# Patient Record
Sex: Male | Born: 1990 | Race: White | Hispanic: No | Marital: Single | Smoking: Current every day smoker
Health system: Southern US, Community
[De-identification: ages and names within clinical notes are randomized; demographics above are authoritative.]

## PROBLEM LIST (undated history)

## (undated) DIAGNOSIS — Z21 Asymptomatic human immunodeficiency virus [HIV] infection status: Secondary | ICD-10-CM

## (undated) DIAGNOSIS — G039 Meningitis, unspecified: Secondary | ICD-10-CM

## (undated) DIAGNOSIS — F32A Depression, unspecified: Secondary | ICD-10-CM

## (undated) DIAGNOSIS — F319 Bipolar disorder, unspecified: Secondary | ICD-10-CM

## (undated) DIAGNOSIS — F988 Other specified behavioral and emotional disorders with onset usually occurring in childhood and adolescence: Secondary | ICD-10-CM

## (undated) DIAGNOSIS — F329 Major depressive disorder, single episode, unspecified: Secondary | ICD-10-CM

## (undated) DIAGNOSIS — F419 Anxiety disorder, unspecified: Secondary | ICD-10-CM

## (undated) HISTORY — DX: Other specified behavioral and emotional disorders with onset usually occurring in childhood and adolescence: F98.8

## (undated) HISTORY — DX: Depression, unspecified: F32.A

## (undated) HISTORY — DX: Bipolar disorder, unspecified: F31.9

## (undated) HISTORY — DX: Meningitis, unspecified: G03.9

## (undated) HISTORY — DX: Anxiety disorder, unspecified: F41.9

## (undated) HISTORY — DX: Major depressive disorder, single episode, unspecified: F32.9

## (undated) HISTORY — PX: NO PAST SURGERIES: SHX2092

---
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
## 2009-07-18 ENCOUNTER — Telehealth: Payer: Self-pay | Admitting: *Deleted

## 2010-03-20 ENCOUNTER — Encounter: Admission: RE | Admit: 2010-03-20 | Discharge: 2010-03-20 | Payer: Self-pay | Admitting: Family Medicine

## 2013-03-23 ENCOUNTER — Ambulatory Visit: Payer: Self-pay | Admitting: Family Medicine

## 2013-04-21 ENCOUNTER — Ambulatory Visit: Payer: Self-pay | Admitting: Family Medicine

## 2013-04-21 DIAGNOSIS — Z0289 Encounter for other administrative examinations: Secondary | ICD-10-CM

## 2014-04-28 ENCOUNTER — Ambulatory Visit (INDEPENDENT_AMBULATORY_CARE_PROVIDER_SITE_OTHER): Payer: BC Managed Care – PPO | Admitting: Family Medicine

## 2014-04-28 VITALS — BP 122/74 | HR 105 | Temp 98.1°F | Resp 16 | Ht 71.5 in | Wt 212.0 lb

## 2014-04-28 DIAGNOSIS — F430 Acute stress reaction: Secondary | ICD-10-CM

## 2014-04-28 DIAGNOSIS — R079 Chest pain, unspecified: Secondary | ICD-10-CM

## 2014-04-28 MED ORDER — HYDROXYZINE HCL 25 MG PO TABS: 12.5000 mg | ORAL_TABLET | Freq: Three times a day (TID) | ORAL | Status: DC | PRN

## 2014-04-28 NOTE — Progress Notes (Signed)
Subjective:    Patient ID: Nunzio CobbsJonathan Beattie, male    DOB: 09/06/1990, 24 y.o.   MRN: [id number]  HPI [facility] many years. He presents today with chest pain and anxiety. The patient saw his psychiatrist last week and requested to have his Vyvanse increased from 60 mg to 70 mg so he can concentrate for exams. The plan was to slowly decrease vyvanse over summer when he wasn't in school. He sees the psychiatrist for ADHD. He does not receive regular medical care anywhere. He denies any history of anxiety. He is in school at [employer] with a double major in history and asian studies. He also works 30 hours a week at [employer]. His sister died of a drug overdose about a month ago and he missed a week of school. Exams will be over in 2 weeks. He reports that he has been going "24/7" without a day off from work/school. He will not be attending school this summer, will just be working full time. He is looking forward to the break.  His chest pain started 3 days ago and was worse yesterday. Chest pain is mid sternal and slightly left lateral. Constant, dull pain with intermittent sharp pain. Feels like heart is beating fast. Has trembling and brief blurred vision. Chest pain is better when he is moving around; notices it more if he is sitting and studying. Usually exercises 3x/week. Has not exercised since last week due to busy schedule.  He is able to sleep with melatonin. During this last week is sleeping 6-8 hours a night.   FH- Mother and Father are alive and well. Grandfather died at age 26 of heart attack. Grandmother died at age 26, had CHF/ open heart surgery during last 20 years of her life. Grandfather died of annurysm in mid 4870s.  He denies homicidal or suicidal ideations.  Review of Systems No fever, no cough, + mild nausea, no heartburn    Objective:   Physical Exam  Vitals reviewed. Constitutional: He is oriented to person, place, and time. He appears  well-developed and well-nourished.  Very pleasant male in NAD, mildly diaphoretic during initial part of exam. This resolved as visit progressed.  HENT:  Head: Normocephalic and atraumatic.  Right Ear: External ear normal.  Left Ear: External ear normal.  Nose: Nose normal.  Mouth/Throat: Oropharynx is clear and moist.  Eyes: Conjunctivae are normal. Right eye exhibits no discharge. Left eye exhibits no discharge. No scleral icterus.  Neck: Normal range of motion. Neck supple.  Cardiovascular: Normal rate, regular rhythm and normal heart sounds.   Mild tachycardia.   Pulmonary/Chest: Effort normal and breath sounds normal. No respiratory distress. He has no wheezes. He has no rales. He exhibits no tenderness.  Musculoskeletal: Normal range of motion.  Neurological: He is alert and oriented to person, place, and time.  Skin: Skin is warm and dry.  Psychiatric: His behavior is normal. Judgment and thought content normal.  Patient mildly anxious, became less anxious appearing as visit progressed.    EKG- reviewed by Dr. Katrinka BlazingSmith- non-specific T wave changes, bradycardia.    Assessment & Plan:  1. Chest pain - EKG 12-Lead  2. Stress response -Discussed with Dr. Katrinka BlazingSmith -Discussed EKG findings with patient. Reassured patient.  -Encouraged patient to call psychiatrist and discuss reducing dose of Vyvanse. - Vistaril 25 mg, 1/2-1 tablet every 8 hours as needed for anxiety -can take ibuprofen 400 mg po q 8 hours for pain -  encouraged continued exercise for stress relief -RTC if any worsening in symptoms, or if no improvement  Emi Belfast, FNP-BC  [facility]  04/28/2014 4:12 PM

## 2014-05-05 NOTE — Progress Notes (Signed)
History and physical examinations reviewed with Deboraha Sprang, FNP.  EKG reviewed. Agree with A/P.

## 2014-05-08 ENCOUNTER — Ambulatory Visit (INDEPENDENT_AMBULATORY_CARE_PROVIDER_SITE_OTHER): Payer: BC Managed Care – PPO | Admitting: Family Medicine

## 2014-05-08 VITALS — BP 130/84 | HR 94 | Temp 98.0°F | Resp 18 | Ht 71.0 in | Wt 218.0 lb

## 2014-05-08 DIAGNOSIS — J351 Hypertrophy of tonsils: Secondary | ICD-10-CM

## 2014-05-08 DIAGNOSIS — H9209 Otalgia, unspecified ear: Secondary | ICD-10-CM

## 2014-05-08 DIAGNOSIS — J3489 Other specified disorders of nose and nasal sinuses: Secondary | ICD-10-CM

## 2014-05-08 DIAGNOSIS — R0981 Nasal congestion: Secondary | ICD-10-CM

## 2014-05-08 DIAGNOSIS — J029 Acute pharyngitis, unspecified: Secondary | ICD-10-CM

## 2014-05-08 LAB — POCT RAPID STREP A (OFFICE): Rapid Strep A Screen: NEGATIVE

## 2014-05-08 MED ORDER — AMOXICILLIN 875 MG PO TABS: 875.0000 mg | ORAL_TABLET | Freq: Two times a day (BID) | ORAL | Status: DC

## 2014-05-08 NOTE — Progress Notes (Signed)
Chief Complaint:  Chief Complaint  Patient presents with  . Sore Throat    x 1 day  . Nasal Congestion    HPI: Collin [name] is a 24 y.o. male who is here for 1 day history of sore throat and nasal congestion, it has been draining clear liquid, he denies allergies or asthma. He wants to feel better and has 2 exams and [place] on Monday. No fevers or chills. Has had strep throat in the past several times and was about to remove tonsils but never did. He has tried nothing for this.   He has had a tough semester. HE was seen on 04/28/14 for chest pain , anxiety. At that time he had requested his psychiatrist change his Vyvanse increased from 60 mg to 70 mg so he can concentrate for exams. He is still on a higher dose. The plan was to slowly decrease vyvanse over summer when he wasn't in school. He sees the psychiatrist for ADHD.  He is in school at [employer] with a double major in history and international studies. He also works 30 hours a week at [employer]. His sister died of a drug overdose about a month ago and he missed a week of school. He is in the midst of exams .   He has not dealt with his sisters death, he sttes he will do it all once exams are over. He is no suicidal or homicidal.   Past Medical History  Diagnosis Date  . Anxiety    History reviewed. No pertinent past surgical history. History   Social History  . Marital Status: Single    Spouse Name: N/A    Number of Children: N/A  . Years of Education: N/A   Social History Main Topics  . Smoking status: Current Every Day Smoker  . Smokeless tobacco: Never Used  . Alcohol Use: Yes  . Drug Use: None  . Sexual Activity: None   Other Topics Concern  . None   Social History Narrative  . None   Family History  Problem Relation Age of Onset  . Hypertension Father   . Stroke Paternal Uncle   . Heart disease Maternal Grandmother   . Heart disease Maternal Grandfather   . Stroke Paternal  Grandfather    No Known Allergies Prior to Admission medications   Medication Sig Start Date End Date Taking? Authorizing Provider  hydrOXYzine (ATARAX/VISTARIL) 25 MG tablet Take 0.5-1 tablets (12.5-25 mg total) by mouth every 8 (eight) hours as needed for anxiety. 04/28/14  Yes Collin Belfast, FNP  lisdexamfetamine (VYVANSE) 70 MG capsule Take 70 mg by mouth daily.   Yes Historical Provider, MD     ROS: The patient denies fevers, chills, night sweats, unintentional weight loss, chest pain, palpitations, wheezing, dyspnea on exertion, nausea, vomiting, abdominal pain, dysuria, hematuria, melena, numbness, weakness, or tingling.   All other systems have been reviewed and were otherwise negative with the exception of those mentioned in the HPI and as above.    PHYSICAL EXAM: Filed Vitals:   05/08/14 0926  BP: 130/84  Pulse: 94  Temp: 98 F (36.7 C)  Resp: 18   Filed Vitals:   05/08/14 0926  Height: 5\' 11"  (1.803 m)  Weight: 218 lb (98.884 kg)   Body mass index is 30.42 kg/(m^2).  General: Alert, no acute distress HEENT:  Normocephalic, atraumatic, oropharynx patent. EOMI, PERRLA, tm nl, no exudates, +enlarged red tonsils, nontener sinuses Cardiovascular:  Regular rate  and rhythm, no rubs murmurs or gallops.  No Carotid bruits, radial pulse intact. No pedal edema.  Respiratory: Clear to auscultation bilaterally.  No wheezes, rales, or rhonchi.  No cyanosis, no use of accessory musculature GI: No organomegaly, abdomen is soft and non-tender, positive bowel sounds.  No masses. Skin: No rashes. Neurologic: Facial musculature symmetric. Psychiatric: Patient is appropriate throughout our interaction. Lymphatic: No cervical lymphadenopathy Musculoskeletal: Gait intact.   LABS: Results for orders placed in visit on 12/16/07  CONVERTED [facility] LAB      Result Value Ref Range   Rapid Strep negative       EKG/XRAY:   Primary read interpreted [facility] Conley [name] at  [facility].   ASSESSMENT/PLAN: Encounter Diagnoses  Name Primary?  . Acute pharyngitis Yes  . Nasal congestion   . Otalgia   . Enlarged tonsils    MOstl likely stress related and allergies, he is decompensating physically due to lack of rest.  Rx Amoxacillin  Salt water gargles Throat cx pending Advise to seek bereavement couseling and continue seeing therapis/psychiatrist F/u prn  Gross sideeffects, risk and benefits, and alternatives of medications d/w patient. Patient is aware that all medications have potential sideeffects and we are unable to predict every sideeffect or drug-drug interaction that may occur.  Collin Antuhao P Ghali Morissette, DO 05/08/2014 10:57 AM

## 2014-05-08 NOTE — Patient Instructions (Signed)
Pharyngitis °Pharyngitis is redness, pain, and swelling (inflammation) of your pharynx.  °CAUSES  °Pharyngitis is usually caused by infection. Most of the time, these infections are from viruses (viral) and are part of a cold. However, sometimes pharyngitis is caused by bacteria (bacterial). Pharyngitis can also be caused by allergies. Viral pharyngitis may be spread from person to person by coughing, sneezing, and personal items or utensils (cups, forks, spoons, toothbrushes). Bacterial pharyngitis may be spread from person to person by more intimate contact, such as kissing.  °SIGNS AND SYMPTOMS  °Symptoms of pharyngitis include:   °· Sore throat.   °· Tiredness (fatigue).   °· Low-grade fever.   °· Headache. °· Joint pain and muscle aches. °· Skin rashes. °· Swollen lymph nodes. °· Plaque-like film on throat or tonsils (often seen with bacterial pharyngitis). °DIAGNOSIS  °Your health care provider will ask you questions about your illness and your symptoms. Your medical history, along with a physical exam, is often all that is needed to diagnose pharyngitis. Sometimes, a rapid strep test is done. Other lab tests may also be done, depending on the suspected cause.  °TREATMENT  °Viral pharyngitis will usually get better in 3 4 days without the use of medicine. Bacterial pharyngitis is treated with medicines that kill germs (antibiotics).  °HOME CARE INSTRUCTIONS  °· Drink enough water and fluids to keep your urine clear or pale yellow.   °· Only take over-the-counter or prescription medicines as directed by your health care provider:   °· If you are prescribed antibiotics, make sure you finish them even if you start to feel better.   °· Do not take aspirin.   °· Get lots of rest.   °· Gargle with 8 oz of salt water (½ tsp of salt per 1 qt of water) as often as every 1 2 hours to soothe your throat.   °· Throat lozenges (if you are not at risk for choking) or sprays may be used to soothe your throat. °SEEK MEDICAL  CARE IF:  °· You have large, tender lumps in your neck. °· You have a rash. °· You cough up green, yellow-brown, or bloody spit. °SEEK IMMEDIATE MEDICAL CARE IF:  °· Your neck becomes stiff. °· You drool or are unable to swallow liquids. °· You vomit or are unable to keep medicines or liquids down. °· You have severe pain that does not go away with the use of recommended medicines. °· You have trouble breathing (not caused by a stuffy nose). °MAKE SURE YOU:  °· Understand these instructions. °· Will watch your condition. °· Will get help right away if you are not doing well or get worse. °Document Released: 12/17/2005 Document Revised: 10/07/2013 Document Reviewed: 08/24/2013 °ExitCare® Patient Information ©2014 [employer]. ° °

## 2014-05-10 LAB — CULTURE, GROUP A STREP: ORGANISM ID, BACTERIA: NORMAL

## 2014-11-17 ENCOUNTER — Telehealth: Payer: Self-pay

## 2014-11-17 ENCOUNTER — Ambulatory Visit (INDEPENDENT_AMBULATORY_CARE_PROVIDER_SITE_OTHER): Payer: BC Managed Care – PPO | Admitting: Physician Assistant

## 2014-11-17 VITALS — BP 128/72 | HR 81 | Temp 97.9°F | Resp 16 | Ht 72.0 in | Wt 194.8 lb

## 2014-11-17 DIAGNOSIS — Z202 Contact with and (suspected) exposure to infections with a predominantly sexual mode of transmission: Secondary | ICD-10-CM

## 2014-11-17 DIAGNOSIS — I889 Nonspecific lymphadenitis, unspecified: Secondary | ICD-10-CM

## 2014-11-17 LAB — CBC WITH DIFFERENTIAL/PLATELET
BASOS PCT: 0 % (ref 0–1)
Basophils Absolute: 0 10*3/uL (ref 0.0–0.1)
EOS ABS: 0 10*3/uL (ref 0.0–0.7)
Eosinophils Relative: 0 % (ref 0–5)
HCT: 46.1 % (ref 39.0–52.0)
HEMOGLOBIN: 15.9 g/dL (ref 13.0–17.0)
LYMPHS ABS: 2.5 10*3/uL (ref 0.7–4.0)
Lymphocytes Relative: 31 % (ref 12–46)
MCH: 29.2 pg (ref 26.0–34.0)
MCHC: 34.5 g/dL (ref 30.0–36.0)
MCV: 84.6 fL (ref 78.0–100.0)
MPV: 9.9 fL (ref 9.4–12.4)
Monocytes Absolute: 0.6 10*3/uL (ref 0.1–1.0)
Monocytes Relative: 7 % (ref 3–12)
NEUTROS ABS: 5.1 10*3/uL (ref 1.7–7.7)
Neutrophils Relative %: 62 % (ref 43–77)
PLATELETS: 254 10*3/uL (ref 150–400)
RBC: 5.45 MIL/uL (ref 4.22–5.81)
RDW: 14.9 % (ref 11.5–15.5)
WBC: 8.2 10*3/uL (ref 4.0–10.5)

## 2014-11-17 NOTE — Progress Notes (Signed)
Subjective:    Patient ID: Collin [name], male    DOB: 07/11/1990, 24 y.o.   MRN: [id number]  HPI Patient presents with for STD testing and lymph adenitis after unprotected sex 3 weeks ago. Was the receiver of unprotected male sx and partner denies current STDs. Had protected vaginal sex and unprotected oral sex with male partner. 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 Denies penile discharge, pain, or blood; scrotal swelling or pain; or rectal pain, discharge, or blood. No urinary sx. Felt lymph nodes in L armpit, L chin, and R groin. Spoke with HIV counselor and is adamant that he has to have HIV viral load testing and willing to pay additional cost if insurance does not cover cost.  PFSH reviewed for this encounter.  Review of Systems  Constitutional: Negative for fever, chills, fatigue and unexpected weight change.  HENT: Negative for congestion and sore throat.   Respiratory: Negative for cough.   Gastrointestinal: Positive for nausea (secondary to anxiety). Negative for vomiting and abdominal pain.  Genitourinary: Negative for dysuria, frequency, hematuria, flank pain, decreased urine volume, discharge, penile swelling, scrotal swelling, difficulty urinating, penile pain and testicular pain.  Skin: Negative for rash.  Hematological: Positive for adenopathy.  Psychiatric/Behavioral: Negative for behavioral problems and dysphoric mood. The patient is nervous/anxious.        Objective:   Physical Exam  Constitutional: He is oriented to person, place, and time. He appears well-developed and well-nourished. No distress.  Blood pressure 128/72, pulse 81, temperature 97.9 F (36.6 C), temperature source Oral, resp. rate 16, height 6' (1.829 m), weight 194 lb 12.8 oz (88.361 kg), SpO2 100 %.   HENT:  Head: Normocephalic and atraumatic.  Eyes: Right eye exhibits no discharge. Left eye exhibits no discharge. No scleral icterus.  Neck: Neck  supple.  Cardiovascular: Normal rate, regular rhythm and normal heart sounds.  Exam reveals no gallop and no friction rub.   No murmur heard. Pulmonary/Chest: Effort normal and breath sounds normal. No respiratory distress. He has no wheezes. He has no rales.  Abdominal: Soft. Bowel sounds are normal. He exhibits no mass. There is no tenderness. There is no rebound and no guarding. Hernia confirmed negative in the right inguinal area and confirmed negative in the left inguinal area.  Genitourinary: Rectum normal and penis normal. Right testis shows tenderness. Right testis shows no mass and no swelling. Right testis is descended. Left testis shows no mass, no swelling and no tenderness. Left testis is descended. No penile tenderness.  Lymphadenopathy:       Head (right side): No submental, no submandibular, no tonsillar, no preauricular, no posterior auricular and no occipital adenopathy present.       Head (left side): No submental, no submandibular, no tonsillar, no preauricular, no posterior auricular and no occipital adenopathy present.    He has no cervical adenopathy.    He has no axillary adenopathy.       Right: No inguinal and no supraclavicular adenopathy present.       Left: No inguinal and no supraclavicular adenopathy present.  Neurological: He is alert and oriented to person, place, and time.  Skin: Skin is warm and intact. No lesion and no rash noted. He is diaphoretic (patient states he is very nervous). No erythema. No pallor.  Psychiatric: His speech is normal and behavior is normal. Judgment and thought content normal. His mood appears anxious. His affect is not angry. Cognition and memory are  normal. He does not exhibit a depressed mood.        Assessment & Plan:  1. Exposure to STD - HSV(herpes simplex vrs) 1+2 ab-IgG - RPR - GC/Chlamydia Probe Amp - HIV 1 RNA quant-no reflex-bld - CBC with Differential - HIV antibody - RTC in 3 months, if HIV antibody neg for  retesting.  2. Lymphadenitis Patient reported. Not seen/felt on exam. - CBC with Differential    Janan Ridgeishira Shaniyah Wix PA-C  [facility] 11/17/2014 3:24 PM

## 2014-11-17 NOTE — Patient Instructions (Signed)

## 2014-11-17 NOTE — Telephone Encounter (Signed)
Pt states he has questions about a particular diagnosis that was on his paperwork when he left today, He would like a call back re same   Best phone [phone]

## 2014-11-17 NOTE — Telephone Encounter (Signed)
Pt had questions about his diagnosis of lymphadenitis. Pt reported not seen/felt on exam (see Tishira's note). Let pt know that Mal Mistyishira had put this as a dx so that hopefully some of his labs would be covered by ins. Discussed with Chelle. Pt understood and had no further questions.

## 2014-11-18 LAB — HIV ANTIBODY (ROUTINE TESTING W REFLEX): HIV 1&2 Ab, 4th Generation: NONREACTIVE

## 2014-11-18 LAB — HSV(HERPES SIMPLEX VRS) I + II AB-IGG

## 2014-11-18 LAB — HIV-1 RNA QUANT-NO REFLEX-BLD: HIV-1 RNA Quant, Log: <1.3 {Log} (ref <1.30)

## 2014-11-18 LAB — RPR

## 2014-11-22 ENCOUNTER — Encounter: Payer: Self-pay | Admitting: Physician Assistant

## 2014-11-22 ENCOUNTER — Telehealth: Payer: Self-pay | Admitting: Physician Assistant

## 2014-11-22 NOTE — Telephone Encounter (Signed)
Called to discuss labs. No vmail set up. Labs normal. Will send letter.

## 2014-11-22 NOTE — Progress Notes (Signed)
I have discussed this case with Ms. Brewington, PA-C and agree.  

## 2014-11-22 NOTE — Telephone Encounter (Signed)
Pt notified of labs. Sheketia, pt says he is happy to come back in if is Uriprobe was never sent. Just call him and let him know.

## 2014-11-24 NOTE — Telephone Encounter (Signed)
Pt will be in on Friday the 27th to recollect uriprobe

## 2014-11-26 ENCOUNTER — Telehealth: Payer: Self-pay | Admitting: Physician Assistant

## 2014-11-26 DIAGNOSIS — Z202 Contact with and (suspected) exposure to infections with a predominantly sexual mode of transmission: Secondary | ICD-10-CM

## 2014-11-30 NOTE — Telephone Encounter (Signed)
Spoke with patient. Agrees to RTC on 12/01/14 to recollect urine for GC probe. Informed him of other negative labs and re-enforced condom use again.

## 2015-03-08 ENCOUNTER — Ambulatory Visit (INDEPENDENT_AMBULATORY_CARE_PROVIDER_SITE_OTHER): Payer: Self-pay | Admitting: Family Medicine

## 2015-03-08 VITALS — BP 118/80 | HR 110 | Temp 98.0°F | Resp 18 | Ht 72.0 in | Wt 207.0 lb

## 2015-03-08 DIAGNOSIS — R5383 Other fatigue: Secondary | ICD-10-CM | POA: Diagnosis not present

## 2015-03-08 DIAGNOSIS — F32A Depression, unspecified: Secondary | ICD-10-CM

## 2015-03-08 DIAGNOSIS — R195 Other fecal abnormalities: Secondary | ICD-10-CM

## 2015-03-08 DIAGNOSIS — L739 Follicular disorder, unspecified: Secondary | ICD-10-CM

## 2015-03-08 DIAGNOSIS — F329 Major depressive disorder, single episode, unspecified: Secondary | ICD-10-CM

## 2015-03-08 DIAGNOSIS — F410 Panic disorder [episodic paroxysmal anxiety] without agoraphobia: Secondary | ICD-10-CM | POA: Diagnosis not present

## 2015-03-08 DIAGNOSIS — Z113 Encounter for screening for infections with a predominantly sexual mode of transmission: Secondary | ICD-10-CM

## 2015-03-08 LAB — COMPLETE METABOLIC PANEL WITH GFR
ALBUMIN: 4.7 g/dL (ref 3.5–5.2)
ALT: 17 U/L (ref 0–53)
AST: 20 U/L (ref 0–37)
Alkaline Phosphatase: 57 U/L (ref 39–117)
BUN: 8 mg/dL (ref 6–23)
CALCIUM: 9.5 mg/dL (ref 8.4–10.5)
CHLORIDE: 103 meq/L (ref 96–112)
CO2: 26 mEq/L (ref 19–32)
Creat: 0.89 mg/dL (ref 0.50–1.35)
GFR, Est African American: >89 mL/min
GLUCOSE: 81 mg/dL (ref 70–99)
POTASSIUM: 4.2 meq/L (ref 3.5–5.3)
Sodium: 141 mEq/L (ref 135–145)
Total Bilirubin: 0.5 mg/dL (ref 0.2–1.2)
Total Protein: 7.5 g/dL (ref 6.0–8.3)

## 2015-03-08 LAB — POCT CBC
GRANULOCYTE PERCENT: 58.2 % (ref 37–80)
HCT, POC: 48 % (ref 43.5–53.7)
Hemoglobin: 16.2 g/dL (ref 14.1–18.1)
Lymph, poc: 3.2 (ref 0.6–3.4)
MCH, POC: 30.4 pg (ref 27–31.2)
MCHC: 33.8 g/dL (ref 31.8–35.4)
MCV: 89.9 fL (ref 80–97)
MID (CBC): 0.3 (ref 0–0.9)
MPV: 7.1 fL (ref 0–99.8)
PLATELET COUNT, POC: 245 10*3/uL (ref 142–424)
POC Granulocyte: 4.9 (ref 2–6.9)
POC LYMPH PERCENT: 37.9 %L (ref 10–50)
POC MID %: 3.9 % (ref 0–12)
RBC: 5.34 M/uL (ref 4.69–6.13)
RDW, POC: 12.2 %
WBC: 8.5 10*3/uL (ref 4.6–10.2)

## 2015-03-08 LAB — TSH: TSH: 0.877 u[IU]/mL (ref 0.350–4.500)

## 2015-03-08 MED ORDER — CLINDAMYCIN PHOSPHATE 1 % EX SOLN: Freq: Two times a day (BID) | CUTANEOUS | Status: DC

## 2015-03-08 MED ORDER — SERTRALINE HCL 50 MG PO TABS: 50.0000 mg | ORAL_TABLET | Freq: Every day | ORAL | Status: DC

## 2015-03-08 NOTE — Progress Notes (Signed)
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 He has a history of ADHD and sees a psychiatrist for that. Dr. Mervyn Skeeters (on [facility]) who is his psychiatrist offered him antidepressant a couple months ago and the patient declined. He recently was changed from Vyvanse to Adderall. He has been extremely fatigued the last 2 weeks. His sister committed suicide about a year ago. He said his other sister got pregnant and had the baby baptized a week or 2 ago near the anniversary of the sister's death and "that was a little traumatic to him. He is stressed with the last semester of school. He is not getting regular exercise. He has been gaining weight. He started a"cleansing diet" yesterday. He is spiritual and has been getting back into going to church occasionally. He doesn't have friends. He is mostly in his apartment alone and little contact with people. He has a little depressed but denies any suicidal thoughts. There is a fairly strong family history of suicide with an AFI also committed suicide. He has some diarrhea. His stomach just doesn't feel well.  Objective Overweight young man. He's got a lot of folliculitis on his chest wall and some on his back. This bothers him. His throat is clear. Neck supple without nodes thyromegaly. No carotid bruits. Chest clear. Heart regular without murmurs. And soft without mass or tenderness.  Assessment: Fatigue Depression/occasional panic Weight gain Folliculitis ADHD  Plan: Called his psychiatrist who did not call me back until long after the patient gone. I did place the patient on sertraline 50 mg one daily and the psychiatrist agrees with that. Labs were ordered. Treat the folliculitis with some Cleocin T. Decided to not give him doxycycline right now since he is having some loose stools.   Results for orders placed or performed in visit on 03/08/15  POCT CBC  Result Value Ref Range   WBC 8.5 4.6 - 10.2 K/uL   Lymph, poc 3.2 0.6 - 3.4   POC LYMPH  PERCENT 37.9 10 - 50 %L   MID (cbc) 0.3 0 - 0.9   POC MID % 3.9 0 - 12 %M   POC Granulocyte 4.9 2 - 6.9   Granulocyte percent 58.2 37 - 80 %G   RBC 5.34 4.69 - 6.13 M/uL   Hemoglobin 16.2 14.1 - 18.1 g/dL   HCT, POC 51.7 61.6 - 53.7 %   MCV 89.9 80 - 97 fL   MCH, POC 30.4 27 - 31.2 pg   MCHC 33.8 31.8 - 35.4 g/dL   RDW, POC 07.3 %   Platelet Count, POC 245 142 - 424 K/uL   MPV 7.1 0 - 99.8 fL   See instructions. See him back in a couple of weeks, sooner if needed.

## 2015-03-08 NOTE — Patient Instructions (Signed)
Take sertraline 1 each morning  Regular exercise  Use the Cleocin topical on the rash areas of your chest wall and back once or twice daily  Return in 2-3 weeks. Call over here and find out when I'll be in the office before you come in.

## 2015-03-09 ENCOUNTER — Encounter: Payer: Self-pay | Admitting: *Deleted

## 2015-03-09 LAB — HIV ANTIBODY (ROUTINE TESTING W REFLEX): HIV 1&2 Ab, 4th Generation: NONREACTIVE

## 2015-03-09 LAB — RPR

## 2015-03-31 ENCOUNTER — Ambulatory Visit (INDEPENDENT_AMBULATORY_CARE_PROVIDER_SITE_OTHER): Payer: BLUE CROSS/BLUE SHIELD | Admitting: Family Medicine

## 2015-03-31 VITALS — BP 114/78 | HR 110 | Temp 98.4°F | Resp 16 | Ht 72.0 in | Wt 204.0 lb

## 2015-03-31 DIAGNOSIS — R3911 Hesitancy of micturition: Secondary | ICD-10-CM | POA: Diagnosis not present

## 2015-03-31 DIAGNOSIS — L290 Pruritus ani: Secondary | ICD-10-CM

## 2015-03-31 DIAGNOSIS — F411 Generalized anxiety disorder: Secondary | ICD-10-CM

## 2015-03-31 DIAGNOSIS — F329 Major depressive disorder, single episode, unspecified: Secondary | ICD-10-CM

## 2015-03-31 DIAGNOSIS — F32A Depression, unspecified: Secondary | ICD-10-CM

## 2015-03-31 DIAGNOSIS — Z711 Person with feared health complaint in whom no diagnosis is made: Secondary | ICD-10-CM

## 2015-03-31 DIAGNOSIS — K6289 Other specified diseases of anus and rectum: Secondary | ICD-10-CM

## 2015-03-31 DIAGNOSIS — K625 Hemorrhage of anus and rectum: Secondary | ICD-10-CM

## 2015-03-31 LAB — POCT CBC
Granulocyte percent: 64.5 %G (ref 37–80)
HCT, POC: 49.9 % (ref 43.5–53.7)
HEMOGLOBIN: 16.3 g/dL (ref 14.1–18.1)
LYMPH, POC: 3 (ref 0.6–3.4)
MCH: 29.5 pg (ref 27–31.2)
MCHC: 32.7 g/dL (ref 31.8–35.4)
MCV: 90.3 fL (ref 80–97)
MID (CBC): 0.5 (ref 0–0.9)
MPV: 7.6 fL (ref 0–99.8)
PLATELET COUNT, POC: 335 10*3/uL (ref 142–424)
POC Granulocyte: 6.3 (ref 2–6.9)
POC LYMPH %: 30.6 % (ref 10–50)
POC MID %: 4.9 % (ref 0–12)
RBC: 5.52 M/uL (ref 4.69–6.13)
RDW, POC: 14.5 %
WBC: 9.7 10*3/uL (ref 4.6–10.2)

## 2015-03-31 LAB — POCT URINALYSIS DIPSTICK
Glucose, UA: NEGATIVE
KETONES UA: 8
Leukocytes, UA: NEGATIVE
Nitrite, UA: NEGATIVE
PH UA: 6
PROTEIN UA: NEGATIVE
RBC UA: NEGATIVE
SPEC GRAV UA: 1.025
Urobilinogen, UA: 0.2

## 2015-03-31 MED ORDER — TAMSULOSIN HCL 0.4 MG PO CAPS: 0.4000 mg | ORAL_CAPSULE | Freq: Every day | ORAL | Status: DC

## 2015-03-31 MED ORDER — HYDROCORTISONE ACE-PRAMOXINE 2.5-1 % RE CREA: TOPICAL_CREAM | RECTAL | Status: DC

## 2015-03-31 NOTE — Progress Notes (Signed)
Subjective: 19 sexual male who was seen by me recently. His depression and anxiety of improved. He does have problems over the last couple weeks. He went for several days with having a lot of bright red rectal bleeding. He had perianal irritation in his mother who is a nurse check him in the did not see any hemorrhoids. He does have a history of being bisexual. Is been at least 3 months since he has been with a male. He is trying to get away from that, but has a male girlfriend. She is over and strain, but right after she left she developed a bad fever blister. He worries about herpes. About the timing of the rectal bleeding he had just generalized malaise and feeling real bad. His last GC chlamydia test was lost. He has had a difficult time urinating. Feels like it just takes a lot of work to make it go. In the morning often has to get in the shower to urinate.  Objective: No acute distress. Anxious. Chest clear. Heart regular without murmurs. And soft without mass or tenderness. No axillary or inguinal or cervical lymphadenopathy. Normal male external genitalia. Red around the urethra. No lesions on the penis. Normal testicles. No hernias. Anus appears normal, just a little skin erythema run around it. No fissures could be identified but PALPATION it felt a little thickened or scarred and very tender at about the 6:00 position. Digital exam was done and no lesions appreciated.  Assessment: Rectal bleeding Anxiety and depression improved STD risk Urinary hesitancy Anal pain  Plan: Urinalysis URiprobe CBC HSV testing

## 2015-03-31 NOTE — Patient Instructions (Signed)
Continue your antidepressant medication  Use the anal cream 2-3 times daily for irritation around your anus.  Take these tamsulosin 0.4 mg one at bedtime (or suppertime) to help relax the bladder outlet so you can urinate easier. Take caution especially the first few days when you get up quickly that you do not get lightheaded.  If exceeding further blood please return at anytime.  We will let you know the results of your labs in the next couple of days.  If doing well plan to return in late May to follow-up. If problems return sooner.

## 2015-04-02 LAB — GC/CHLAMYDIA PROBE AMP
CT PROBE, AMP APTIMA: NEGATIVE
GC Probe RNA: NEGATIVE

## 2015-04-05 LAB — HSV(HERPES SIMPLEX VRS) I + II AB-IGG
HSV 1 GLYCOPROTEIN G AB, IGG: 1.52 IV — AB
HSV 2 Glycoprotein G Ab, IgG: 2.05 IV — ABNORMAL HIGH

## 2015-04-06 ENCOUNTER — Telehealth: Payer: Self-pay | Admitting: *Deleted

## 2015-04-06 DIAGNOSIS — B009 Herpesviral infection, unspecified: Secondary | ICD-10-CM

## 2015-04-06 MED ORDER — VALACYCLOVIR HCL 1 G PO TABS: 1000.0000 mg | ORAL_TABLET | Freq: Two times a day (BID) | ORAL | Status: DC

## 2015-04-06 NOTE — Telephone Encounter (Signed)
Pt was called and spoken to by Angie P.    Valacyclovir  #20 was ordered through Epic and sent to [facility] on [address] by [employer]

## 2015-04-08 ENCOUNTER — Encounter: Payer: Self-pay | Admitting: Family Medicine

## 2015-05-07 ENCOUNTER — Other Ambulatory Visit: Payer: Self-pay | Admitting: Family Medicine

## 2015-05-09 NOTE — Telephone Encounter (Signed)
Do you want to give RFs? 

## 2016-03-25 DIAGNOSIS — F419 Anxiety disorder, unspecified: Secondary | ICD-10-CM | POA: Insufficient documentation

## 2016-03-25 DIAGNOSIS — M25562 Pain in left knee: Secondary | ICD-10-CM | POA: Insufficient documentation

## 2016-03-25 DIAGNOSIS — F1911 Other psychoactive substance abuse, in remission: Secondary | ICD-10-CM | POA: Insufficient documentation

## 2016-03-25 DIAGNOSIS — F988 Other specified behavioral and emotional disorders with onset usually occurring in childhood and adolescence: Secondary | ICD-10-CM | POA: Insufficient documentation

## 2016-05-02 ENCOUNTER — Encounter: Payer: Self-pay | Admitting: Neurology

## 2016-05-02 ENCOUNTER — Ambulatory Visit (INDEPENDENT_AMBULATORY_CARE_PROVIDER_SITE_OTHER): Payer: BLUE CROSS/BLUE SHIELD | Admitting: Neurology

## 2016-05-02 VITALS — BP 126/82 | HR 82 | Ht 72.0 in | Wt 228.0 lb

## 2016-05-02 DIAGNOSIS — F39 Unspecified mood [affective] disorder: Secondary | ICD-10-CM | POA: Diagnosis not present

## 2016-05-02 DIAGNOSIS — H9191 Unspecified hearing loss, right ear: Secondary | ICD-10-CM | POA: Diagnosis not present

## 2016-05-02 DIAGNOSIS — R410 Disorientation, unspecified: Secondary | ICD-10-CM | POA: Insufficient documentation

## 2016-05-02 NOTE — Progress Notes (Signed)
PATIENT: Collin [name] DOB: [birth year]  Chief Complaint  Patient presents with  . Audiory and Visual Hallucinations    He has a constellation of neurological concerns.       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  Patient reported that he experienced intermittent visual and auditory hallucinations especially during stressful situations, such as pushing himself in academic achievement, he often see blocks of blurs at the ceiling or on the floor, changing positions, sometimes he noticed ceiling and floor move up and down, auditory hallucinations, sometimes he woke up with loud whistling sound and bird chipping or people murmuring.  Reviewing the referring record, and history, patient reported a history of illicit drug abuse, currently use marijuana occasionally,  Patient reported that scored "high" on IQ test, but he has difficulty turning his assignment on time, trouble controlling his anxiety, struggle with his academic performance sometimes.  He wants to rule out neurological cause for his constellation of symptoms.  REVIEW OF SYSTEMS: Full 14 system review of systems performed and notable only for Hearing loss, itching, feeling hot, depression anxiety, decreased energy,  hallucinations, racing thoughts, confusion, insomnia, snoring  ALLERGIES: No Known Allergies  HOME MEDICATIONS: Current Outpatient Prescriptions  Medication Sig Dispense Refill  . amphetamine-dextroamphetamine (ADDERALL XR) 30 MG 24 hr capsule Take 30 mg by mouth daily.    . clindamycin (CLEOCIN-T) 1 % external solution Apply topically 2 (two) times daily. 60 mL 3  . hydrocortisone-pramoxine (ANALPRAM HC) 2.5-1 % rectal cream Use 2-3 times daily as needed for anal irritation 30 g 0  . sertraline (ZOLOFT) 50 MG tablet Take 1 tablet (50 mg total) by mouth daily. 30 tablet 2  . tamsulosin (FLOMAX) 0.4 MG CAPS capsule TAKE 1 CAPSULE (0.4 MG TOTAL) BY MOUTH DAILY AFTER SUPPER. 15 capsule 0  . valACYclovir (VALTREX) 1000 MG tablet Take 1 tablet (1,000 mg total) by mouth 2 (two) times daily. 20 tablet 0   No current facility-administered medications for this visit.    PAST MEDICAL HISTORY: Past Medical History  Diagnosis Date  . Anxiety   . Depression   . ADD (attention deficit disorder)   . Bipolar 1 disorder (HCC)   . Meningitis spinal     PAST SURGICAL HISTORY: No past surgical history on file.  FAMILY HISTORY: Family History  Problem Relation Age of Onset  . Hypertension Father   . Stroke Paternal Uncle   . Heart disease Maternal Grandmother   . Heart disease Maternal Grandfather   . Stroke Paternal Grandfather   . Healthy Mother     SOCIAL HISTORY:  Social History   Social History  . Marital Status: Single    Spouse Name: N/A  . Number of Children: 0  . Years of Education: Bachelors   Occupational History  . Student     Graduates  May 2017 - History/Philos[employer]   Social History Main Topics  . Smoking status: Current Every Day Smoker -- 0.50 packs/day    Types: Cigarettes  . Smokeless tobacco: Never Used  . Alcohol Use: 3.6 oz/week    6 Cans of beer per week     Comment: 6 beers per week  . Drug Use: Yes     Comment: Occasional marijuana use - 1-2 times per  month  . Sexual Activity: Yes    Birth Control/ Protection: Condom, None     Comment: With women and men   Other Topics Concern  . Not on file   Social History Narrative   Lives at home with parents.   Right-handed.   2 caffeinated drinks per day.     PHYSICAL EXAM   There were no vitals filed for this visit.  Not recorded      There is no weight on file to calculate BMI.  PHYSICAL EXAMNIATION:  Gen: NAD, conversant, well nourised, obese, well groomed                     Cardiovascular: Regular rate rhythm, no peripheral edema, warm, nontender. Eyes: Conjunctivae clear without exudates or hemorrhage Neck: Supple, no carotid bruise. Pulmonary: Clear to auscultation bilaterally   NEUROLOGICAL EXAM:  MENTAL STATUS: Speech:    Speech is normal; fluent and spontaneous with normal comprehension.  Cognition:     Orientation to time, place and person     Normal recent and remote memory     Normal Attention span and concentration     Normal Language, naming, repeating,spontaneous speech     Fund of knowledge   CRANIAL NERVES: CN II: Visual fields are full to confrontation. Fundoscopic exam is normal with sharp discs and no vascular changes. Pupils are round equal and briskly reactive to light. CN III, IV, VI: extraocular movement are normal. No ptosis. CN V: Facial sensation is intact to pinprick in all 3 divisions bilaterally. Corneal responses are intact.  CN VII: Face is symmetric with normal eye closure and smile. CN VIII: Decreased hearing on the right side CN IX, X: Palate elevates symmetrically. Phonation is normal. CN XI: Head turning and shoulder shrug are intact CN XII: Tongue is midline with normal movements and no atrophy.  MOTOR: There is no pronator drift of out-stretched arms. Muscle bulk and tone are normal. Muscle strength is normal.  REFLEXES: Reflexes are 2+ and symmetric at the biceps, triceps, knees, and ankles. Plantar responses are  flexor.  SENSORY: Intact to light touch, pinprick, positional sensation and vibratory sensation are intact in fingers and toes.  COORDINATION: Rapid alternating movements and fine finger movements are intact. There is no dysmetria on finger-to-nose and heel-knee-shin.    GAIT/STANCE: Posture is normal. Gait is steady with normal steps, base, arm swing, and turning. Heel and toe walking are normal. Tandem gait is normal.  Romberg is absent.   DIAGNOSTIC DATA (LABS, IMAGING, TESTING) - I reviewed patient records, labs, notes, testing and imaging myself where available.   ASSESSMENT AND PLAN  Gryphon Vanderveen is a 26 y.o. male   History of bacterial meningitis Mood disorder Right hearing loss  He wants to proceed with neurological evaluation to rule out neurological conditions to account for his chronic mood disorder, and right hearing loss, especially in light of his history of childhood bacterial meningitis,  We will proceed with MRI of the brain with and without contrast  Laboratory evaluations  Levert Feinstein, M.D. Ph.D.  [facility] [address], [address] [place], [place] [address] Ph: [phone] Fax: [phone]  CC: Charlies Silvers, PA-C

## 2016-05-03 LAB — COMPREHENSIVE METABOLIC PANEL
A/G RATIO: 1.8 (ref 1.2–2.2)
ALT: 30 IU/L (ref 0–44)
AST: 29 IU/L (ref 0–40)
Albumin: 4.9 g/dL (ref 3.5–5.5)
Alkaline Phosphatase: 81 IU/L (ref 39–117)
BUN/Creatinine Ratio: 15 (ref 9–20)
BUN: 15 mg/dL (ref 6–20)
Bilirubin Total: 0.4 mg/dL (ref 0.0–1.2)
CALCIUM: 10 mg/dL (ref 8.7–10.2)
CHLORIDE: 102 mmol/L (ref 96–106)
CO2: 23 mmol/L (ref 18–29)
Creatinine, Ser: 0.97 mg/dL (ref 0.76–1.27)
GFR calc Af Amer: 123 mL/min/{1.73_m2} (ref >59)
GFR, EST NON AFRICAN AMERICAN: 107 mL/min/{1.73_m2} (ref >59)
Globulin, Total: 2.8 g/dL (ref 1.5–4.5)
Glucose: 84 mg/dL (ref 65–99)
POTASSIUM: 5 mmol/L (ref 3.5–5.2)
Sodium: 142 mmol/L (ref 134–144)
Total Protein: 7.7 g/dL (ref 6.0–8.5)

## 2016-05-03 LAB — HEPATITIS PANEL, ACUTE
HEP A IGM: NEGATIVE
HEP B S AG: NEGATIVE
Hep B C IgM: NEGATIVE
Hep C Virus Ab: <0.1 s/co ratio (ref 0.0–0.9)

## 2016-05-03 LAB — CBC
Hematocrit: 47.8 % (ref 37.5–51.0)
Hemoglobin: 16.3 g/dL (ref 12.6–17.7)
MCH: 30.2 pg (ref 26.6–33.0)
MCHC: 34.1 g/dL (ref 31.5–35.7)
MCV: 89 fL (ref 79–97)
PLATELETS: 229 10*3/uL (ref 150–379)
RBC: 5.4 x10E6/uL (ref 4.14–5.80)
RDW: 13.6 % (ref 12.3–15.4)
WBC: 9.7 10*3/uL (ref 3.4–10.8)

## 2016-05-03 LAB — TSH: TSH: 0.763 u[IU]/mL (ref 0.450–4.500)

## 2016-12-11 ENCOUNTER — Emergency Department
Admission: EM | Admit: 2016-12-11 | Discharge: 2016-12-11 | Disposition: A | Discharge status: Home / Self Care | Payer: Self-pay | Attending: Emergency Medicine | Admitting: Emergency Medicine

## 2016-12-11 ENCOUNTER — Emergency Department: Payer: Self-pay

## 2016-12-11 ENCOUNTER — Encounter: Payer: Self-pay | Admitting: Emergency Medicine

## 2016-12-11 DIAGNOSIS — Z79899 Other long term (current) drug therapy: Secondary | ICD-10-CM | POA: Insufficient documentation

## 2016-12-11 DIAGNOSIS — R410 Disorientation, unspecified: Secondary | ICD-10-CM | POA: Insufficient documentation

## 2016-12-11 DIAGNOSIS — F909 Attention-deficit hyperactivity disorder, unspecified type: Secondary | ICD-10-CM | POA: Insufficient documentation

## 2016-12-11 DIAGNOSIS — F17210 Nicotine dependence, cigarettes, uncomplicated: Secondary | ICD-10-CM | POA: Insufficient documentation

## 2016-12-11 LAB — URINALYSIS, COMPLETE (UACMP) WITH MICROSCOPIC
BILIRUBIN URINE: NEGATIVE
GLUCOSE, UA: NEGATIVE mg/dL
HGB URINE DIPSTICK: NEGATIVE
Ketones, ur: 5 mg/dL — AB
LEUKOCYTES UA: NEGATIVE
NITRITE: NEGATIVE
PROTEIN: NEGATIVE mg/dL
RBC / HPF: NONE SEEN RBC/hpf (ref 0–5)
SPECIFIC GRAVITY, URINE: 1.024 (ref 1.005–1.030)
Squamous Epithelial / LPF: NONE SEEN
pH: 6 (ref 5.0–8.0)

## 2016-12-11 LAB — URINE DRUG SCREEN, QUALITATIVE (ARMC ONLY)
AMPHETAMINES, UR SCREEN: POSITIVE — AB
Barbiturates, Ur Screen: NOT DETECTED
Benzodiazepine, Ur Scrn: NOT DETECTED
CANNABINOID 50 NG, UR ~~LOC~~: NOT DETECTED
Cocaine Metabolite,Ur ~~LOC~~: NOT DETECTED
MDMA (ECSTASY) UR SCREEN: NOT DETECTED
METHADONE SCREEN, URINE: NOT DETECTED
Opiate, Ur Screen: NOT DETECTED
Phencyclidine (PCP) Ur S: NOT DETECTED
TRICYCLIC, UR SCREEN: NOT DETECTED

## 2016-12-11 LAB — CBC
HCT: 51.8 % (ref 40.0–52.0)
HEMOGLOBIN: 17.8 g/dL (ref 13.0–18.0)
MCH: 30.1 pg (ref 26.0–34.0)
MCHC: 34.3 g/dL (ref 32.0–36.0)
MCV: 87.6 fL (ref 80.0–100.0)
PLATELETS: 268 10*3/uL (ref 150–440)
RBC: 5.91 MIL/uL — AB (ref 4.40–5.90)
RDW: 12.6 % (ref 11.5–14.5)
WBC: 10.3 10*3/uL (ref 3.8–10.6)

## 2016-12-11 LAB — COMPREHENSIVE METABOLIC PANEL
ALK PHOS: 79 U/L (ref 38–126)
ALT: 73 U/L — AB (ref 17–63)
ANION GAP: 10 (ref 5–15)
AST: 44 U/L — ABNORMAL HIGH (ref 15–41)
Albumin: 5.2 g/dL — ABNORMAL HIGH (ref 3.5–5.0)
BILIRUBIN TOTAL: 0.6 mg/dL (ref 0.3–1.2)
BUN: 16 mg/dL (ref 6–20)
CALCIUM: 10.3 mg/dL (ref 8.9–10.3)
CO2: 27 mmol/L (ref 22–32)
CREATININE: 1.11 mg/dL (ref 0.61–1.24)
Chloride: 99 mmol/L — ABNORMAL LOW (ref 101–111)
GFR calc non Af Amer: >60 mL/min (ref >60)
Glucose, Bld: 98 mg/dL (ref 65–99)
Potassium: 4.6 mmol/L (ref 3.5–5.1)
Sodium: 136 mmol/L (ref 135–145)
TOTAL PROTEIN: 9.1 g/dL — AB (ref 6.5–8.1)

## 2016-12-11 NOTE — ED Provider Notes (Signed)
[facility] Emergency Department Provider Note  ____________________________________________   I have reviewed the triage vital signs and the nursing notes.   HISTORY  Chief Complaint Altered Mental Status   History limited by: Not Limited   HPI Collin [name] is a 26 y.o. male who presents to the emergency department today because of concerns for headache and confusion. The patient states that he has had a right-sided headache for roughly the past week. He describes a sensation of something moving around in his brain. It is located behind his right eye. He states that today he is also felt confused. He feels like he has had a hard time getting his speech. He has had similar symptoms in the past with his speech and confusion. He does have a psychiatric history states one time he was going to go see a neurologist however psychiatrist adjust his meds and he felt better. He denies any recent adjustment of his meds saying the last time they adjusted was 2 months ago. Patient otherwise has no other medical complaints.   Past Medical History:  Diagnosis Date  . ADD (attention deficit disorder)   . Anxiety   . Bipolar 1 disorder (HCC)   . Depression   . Meningitis spinal     Patient Active Problem List   Diagnosis Date Noted  . Mood disorder (HCC) 05/02/2016  . Confusion 05/02/2016  . Hearing loss in right ear 05/02/2016    Past Surgical History:  Procedure Laterality Date  . NO PAST SURGERIES      Prior to Admission medications   Medication Sig Start Date End Date Taking? Authorizing Provider  amphetamine-dextroamphetamine (ADDERALL XR) 30 MG 24 hr capsule Take 30 mg by mouth daily.    Historical Provider, MD  amphetamine-dextroamphetamine (ADDERALL) 5 MG tablet TAKE 1 TABLET BY MOUTH EVERY DAY AT LUNCH TIME FOR ADHD 04/08/16   Historical Provider, MD  sertraline (ZOLOFT) 50 MG tablet Take 1 tablet (50 mg total) by mouth daily. 03/08/15   Peyton Najjaravid H Hopper,  MD    Allergies Patient has no known allergies.  Family History  Problem Relation Age of Onset  . Hypertension Father   . Healthy Mother   . Stroke Paternal Uncle   . Heart disease Maternal Grandmother   . Heart disease Maternal Grandfather   . Stroke Paternal Grandfather     Social History Social History  Substance Use Topics  . Smoking status: Current Every Day Smoker    Packs/day: 0.50    Types: Cigarettes  . Smokeless tobacco: Never Used  . Alcohol use 3.6 oz/week    6 Cans of beer per week     Comment: 6 beers per week    Review of Systems  Constitutional: Negative for fever. Cardiovascular: Negative for chest pain. Respiratory: Negative for shortness of breath. Gastrointestinal: Negative for abdominal pain, vomiting and diarrhea. Genitourinary: Negative for dysuria. Musculoskeletal: Negative for back pain. Skin: Negative for rash. Neurological: Negative for headaches, focal weakness or numbness.  10-point ROS otherwise negative.  ____________________________________________   PHYSICAL EXAM:  VITAL SIGNS: ED Triage Vitals [12/11/16 1735]  Enc Vitals Group     BP (!) 137/103     Pulse Rate 98     Resp 18     Temp 98.1 F (36.7 C)     Temp Source Oral     SpO2 98 %     Weight      Height 6' (1.829 m)     Head Circumference  Peak Flow      Pain Score 3     Pain Loc      Pain Edu?      Excl. in GC?      Constitutional: Alert and oriented. Well appearing and in no distress. Eyes: Conjunctivae are normal. Normal extraocular movements. ENT   Head: Normocephalic and atraumatic.   Nose: No congestion/rhinnorhea.   Mouth/Throat: Mucous membranes are moist.   Neck: No stridor. Hematological/Lymphatic/Immunilogical: No cervical lymphadenopathy. Cardiovascular: Normal rate, regular rhythm.  No murmurs, rubs, or gallops.  Respiratory: Normal respiratory effort without tachypnea nor retractions. Breath sounds are clear and equal  bilaterally. No wheezes/rales/rhonchi. Gastrointestinal: Soft and non tender. No rebound. No guarding.  Genitourinary: Deferred Musculoskeletal: Normal range of motion in all extremities. No lower extremity edema. Neurologic:  Normal speech and language. No gross focal neurologic deficits are appreciated.  Skin:  Skin is warm, dry and intact. No rash noted.   ____________________________________________    LABS (pertinent positives/negatives)  Labs Reviewed  COMPREHENSIVE METABOLIC PANEL - Abnormal; Notable for the following:       Result Value   Chloride 99 (*)    Total Protein 9.1 (*)    Albumin 5.2 (*)    AST 44 (*)    ALT 73 (*)    All other components within normal limits  CBC - Abnormal; Notable for the following:    RBC 5.91 (*)    All other components within normal limits  URINE DRUG SCREEN, QUALITATIVE ([facility] ONLY) - Abnormal; Notable for the following:    Amphetamines, Ur Screen POSITIVE (*)    All other components within normal limits  URINALYSIS, COMPLETE (UACMP) WITH MICROSCOPIC - Abnormal; Notable for the following:    Color, Urine YELLOW (*)    APPearance CLEAR (*)    Ketones, ur 5 (*)    Bacteria, UA RARE (*)    All other components within normal limits  CBG MONITORING, ED     ____________________________________________   EKG  None  ____________________________________________    RADIOLOGY  None  ____________________________________________   PROCEDURES  Procedures  ____________________________________________   INITIAL IMPRESSION / ASSESSMENT AND PLAN / ED COURSE  Pertinent labs & imaging results that were available during my care of the patient were reviewed by me and considered in my medical decision making (see chart for details).  Patient presented to the emergency department today with concerns for confusion. Exam patient is no distress. Slightly pressured and tangential speech. He does have occasional positives in his speech.  Given that he was complaining of a headache a CT scan was done which did not show any acute intracranial abnormalities. Will plan on discharge patient to follow-up with psychiatry. He was also risk as seeing neurology follow-up. ____________________________________________   FINAL CLINICAL IMPRESSION(S) / ED DIAGNOSES  Final diagnoses:  Confusion     Note: This dictation was prepared with Dragon dictation. Any transcriptional errors that result from this process are unintentional     Phineas Semen, MD 12/11/16 2259

## 2016-12-11 NOTE — ED Triage Notes (Addendum)
Pt presents with confusion for one week. States he feels like he is confused and has some pressure in the right side of his head. Pt states he had meningitis when he was a baby and left him with little hearing on the right side. Pt denies hearing any voices and or SI or HI.  Pt states he takes zoloft and adderall.

## 2016-12-11 NOTE — Discharge Instructions (Signed)
Please seek medical attention for any high fevers, chest pain, shortness of breath, change in behavior, persistent vomiting, bloody stool or any other new or concerning symptoms.  

## 2018-03-03 IMAGING — CT CT HEAD W/O CM
3 series · 15 of 47 positions shown, 18 images · non-contrast
Comparison: 03/20/2010

CLINICAL DATA: Confusion for 1 week pressure on the right side of
head

EXAM:
CT HEAD WITHOUT CONTRAST
TECHNIQUE: Contiguous axial images were obtained from the base of the skull
through the vertex without intravenous contrast.

[Series 2: head wo · axial · 0.44mm/px · z∈[+158,+283]mm · 9 of 31 slices shown, 12 images]
[im 3/31  brain]
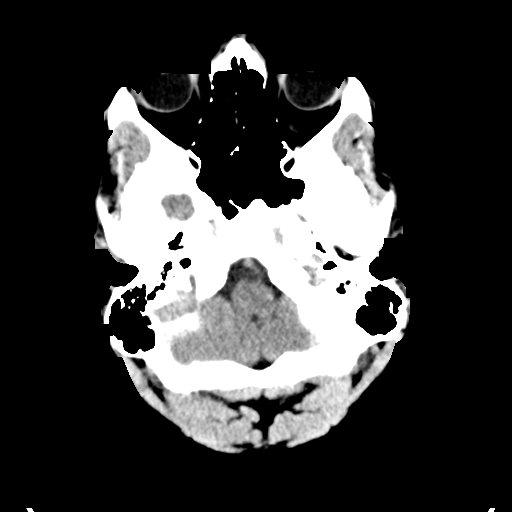
[im 3/31  bone]
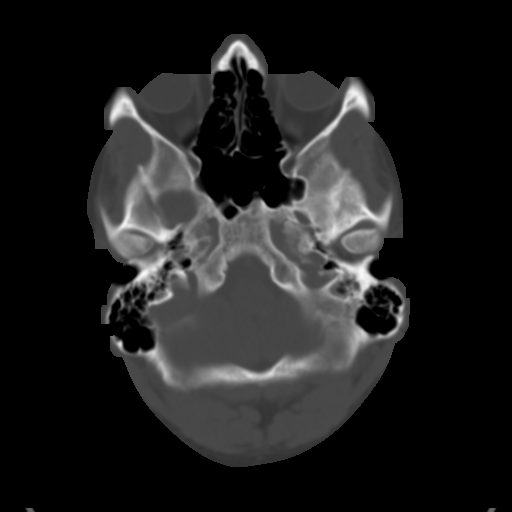
[im 6/31  brain]
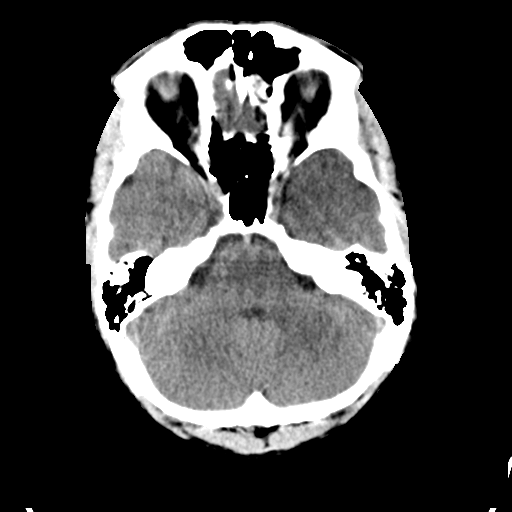
[im 9/31  brain]
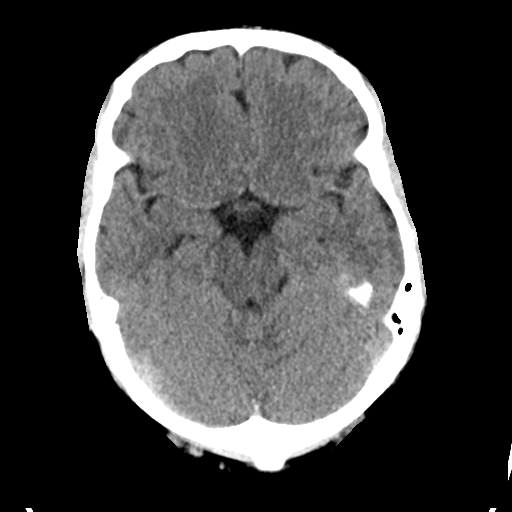
[im 12/31  brain]
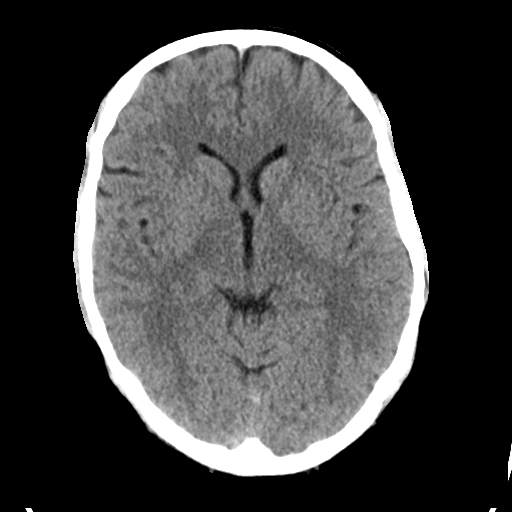
[im 16/31  brain]
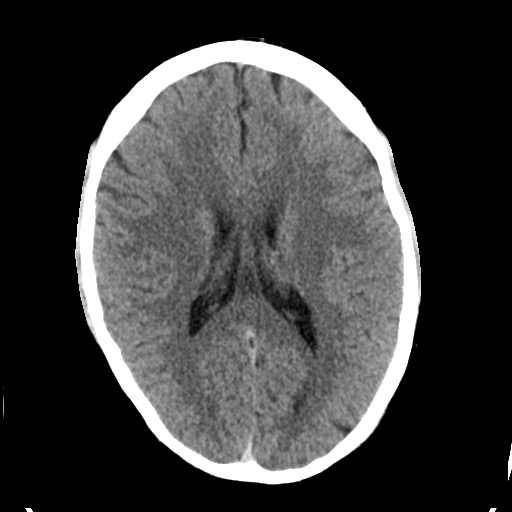
[im 16/31  bone]
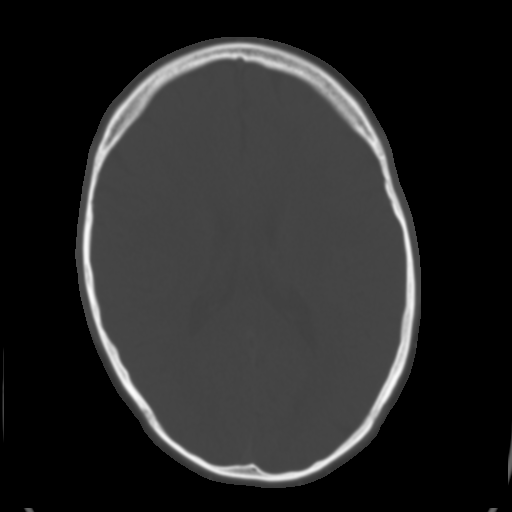
[im 19/31  brain]
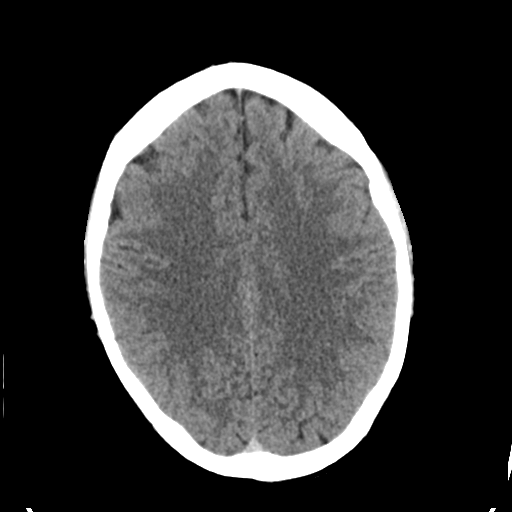
[im 22/31  brain]
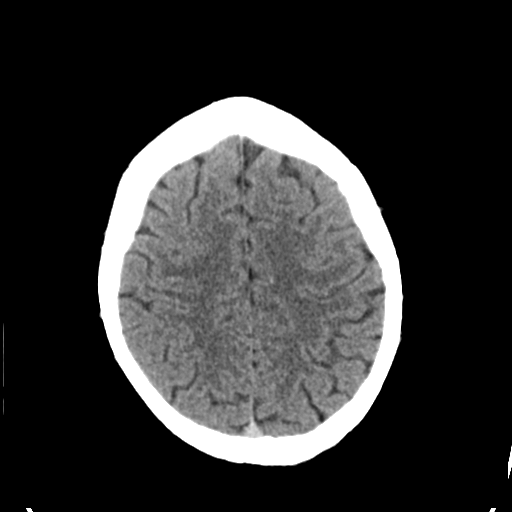
[im 25/31  brain]
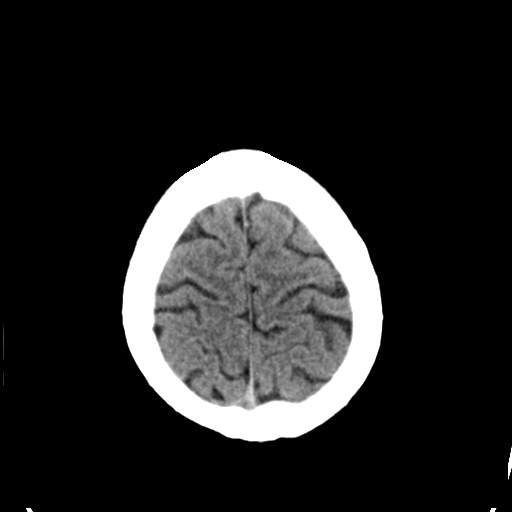
[im 28/31  brain]
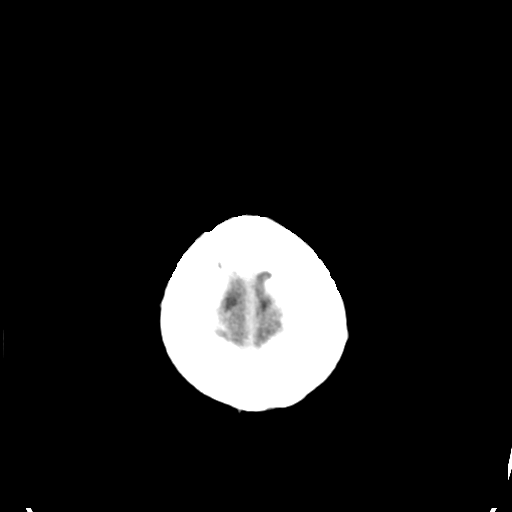
[im 28/31  bone]
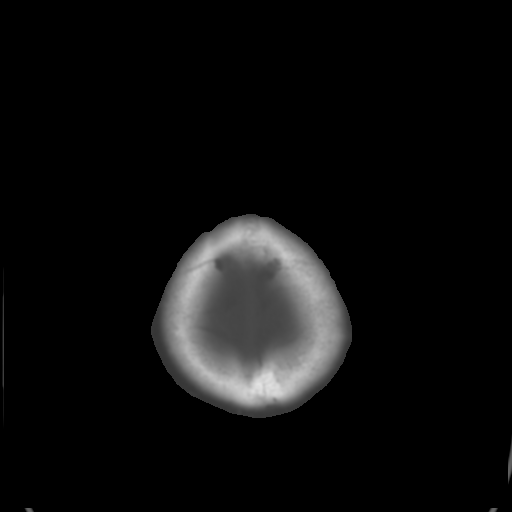

[Series 4: coronal soft tissue · coronal · 0.33mm/px · 3 of 69 slices shown]
[im 23/69  brain]
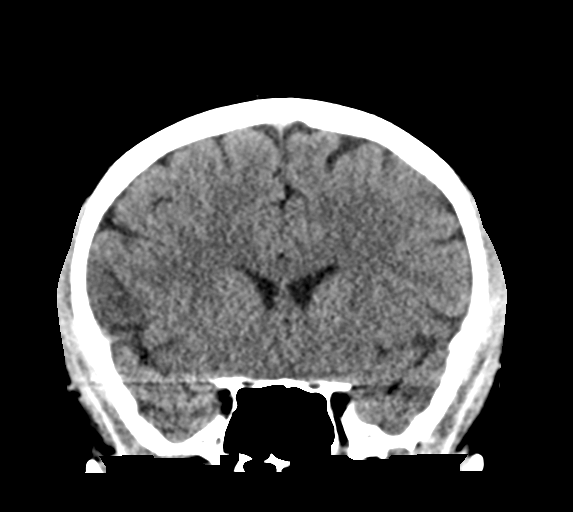
[im 31/69  brain]
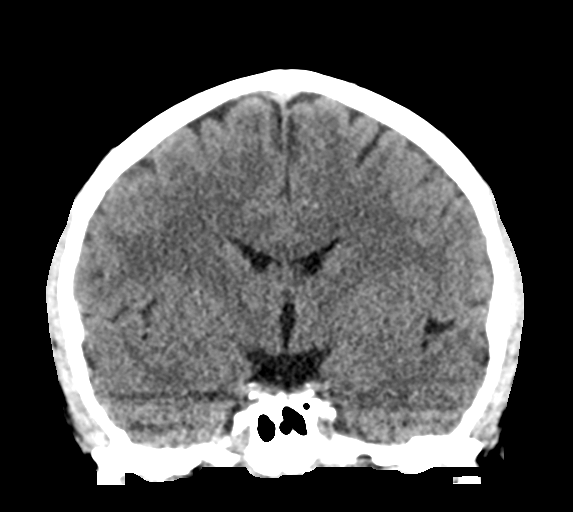
[im 38/69  brain]
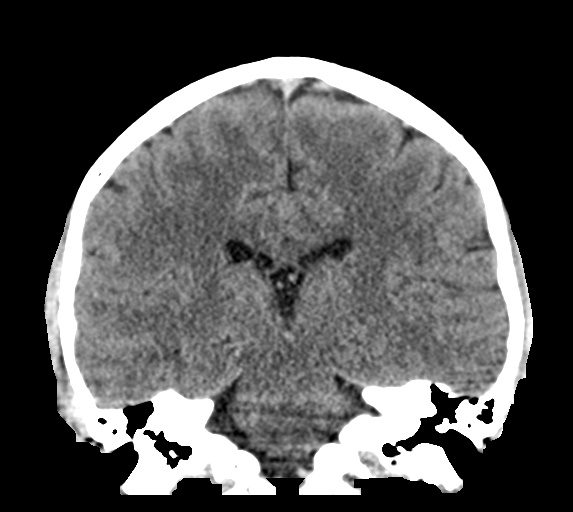

[Series 5: sagittal soft tissue · sagittal · 0.32mm/px · 3 of 55 slices shown]
[im 19/55  brain]
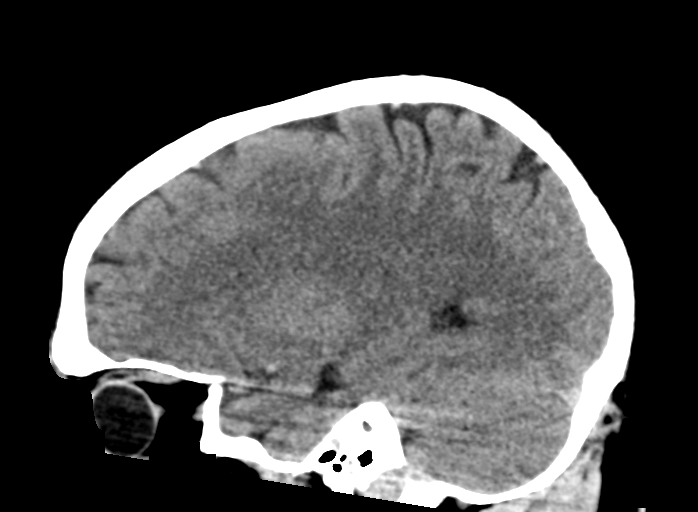
[im 28/55  brain]
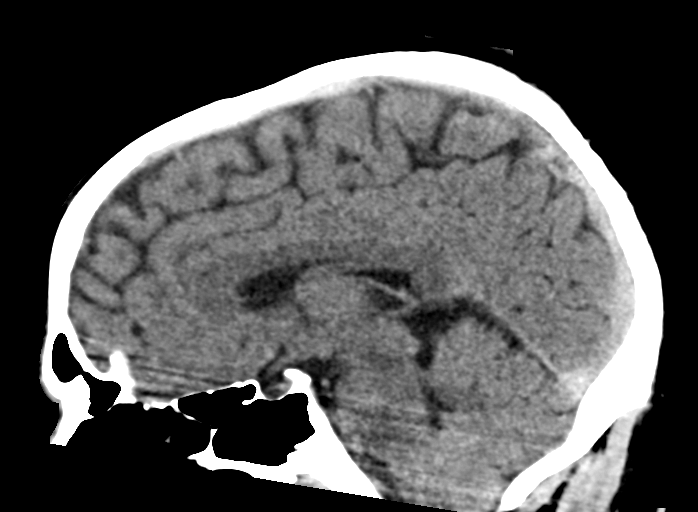
[im 37/55  brain]
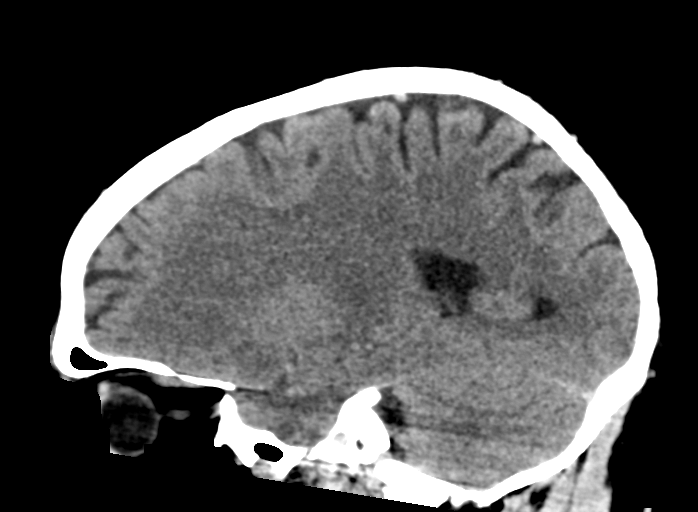

[15 of 47 positions shown; findings below may reference images not displayed]

FINDINGS: Brain: No evidence of acute infarction, hemorrhage, hydrocephalus,
extra-axial collection or mass lesion/mass effect.

Vascular: No hyperdense vessel or unexpected calcification.

Skull: Normal. Negative for fracture or focal lesion.

Sinuses/Orbits: No acute finding.

Other: None
IMPRESSION: No CT evidence for acute intracranial abnormality

## 2018-03-17 ENCOUNTER — Other Ambulatory Visit: Payer: Self-pay

## 2018-03-17 ENCOUNTER — Encounter: Payer: Self-pay | Admitting: Physician Assistant

## 2018-03-17 ENCOUNTER — Ambulatory Visit (INDEPENDENT_AMBULATORY_CARE_PROVIDER_SITE_OTHER): Payer: BLUE CROSS/BLUE SHIELD

## 2018-03-17 ENCOUNTER — Ambulatory Visit: Payer: BLUE CROSS/BLUE SHIELD | Admitting: Physician Assistant

## 2018-03-17 VITALS — BP 130/80 | HR 116 | Temp 98.3°F | Resp 18 | Ht 72.64 in | Wt 250.0 lb

## 2018-03-17 DIAGNOSIS — J029 Acute pharyngitis, unspecified: Secondary | ICD-10-CM | POA: Diagnosis not present

## 2018-03-17 DIAGNOSIS — R059 Cough, unspecified: Secondary | ICD-10-CM

## 2018-03-17 DIAGNOSIS — R05 Cough: Secondary | ICD-10-CM | POA: Diagnosis not present

## 2018-03-17 DIAGNOSIS — H6121 Impacted cerumen, right ear: Secondary | ICD-10-CM | POA: Diagnosis not present

## 2018-03-17 DIAGNOSIS — J069 Acute upper respiratory infection, unspecified: Secondary | ICD-10-CM

## 2018-03-17 DIAGNOSIS — R0989 Other specified symptoms and signs involving the circulatory and respiratory systems: Secondary | ICD-10-CM | POA: Diagnosis not present

## 2018-03-17 MED ORDER — BENZONATATE 100 MG PO CAPS: 100.0000 mg | ORAL_CAPSULE | Freq: Three times a day (TID) | ORAL | 0 refills | Status: DC | PRN

## 2018-03-17 MED ORDER — MUCINEX DM MAXIMUM STRENGTH 60-1200 MG PO TB12: 1.0000 | ORAL_TABLET | Freq: Two times a day (BID) | ORAL | 0 refills | Status: DC

## 2018-03-17 MED ORDER — AZELASTINE HCL 0.1 % NA SOLN: 2.0000 | Freq: Two times a day (BID) | NASAL | 0 refills | Status: DC

## 2018-03-17 NOTE — Progress Notes (Signed)
MRN: [id number] DOB: Oct 30, 1990  Subjective:   Collin [name] is a 28 y.o. male presenting for chief complaint of Cough (X 3 days- with some ear pain) and Sore Throat (X 4 days) .  Reports 4 day history of sore throat, ear fullness, dry cough, subjective fever, and body aches.  Has tried inhaler and mucinex with no full relief. Denies sinus pain, wheezing, shortness of breath and chest pain, nausea, vomiting, abdominal pain and diarrhea. Has had sick contact with coworkers.  No history of seasonal allergies, no history of asthma. Has hx of bronchitis and pneumonia. Patient has not had flu shot this season. Current smoker. Smokes up to a pack per day.  Has smoked since age 13.  In terms of ear fullness,right ear feels worse.  Denies ear pain and itching.  Has history of hearing loss in the right ear.  No other questions or concerns. Collin [name] has a current medication list which includes the following prescription(s): lisdexamfetamine, sertraline, amphetamine-dextroamphetamine, and amphetamine-dextroamphetamine. Also has No Known Allergies.  Collin [name]  has a past medical history of ADD (attention deficit disorder), Anxiety, Bipolar 1 disorder (HCC), Depression, and Meningitis spinal. Also  has a past surgical history that includes No past surgeries.   Objective:   Vitals: BP 130/80 (BP Location: Right Arm, Patient Position: Sitting, Cuff Size: Large)   Pulse (!) 116   Temp 98.3 F (36.8 C) (Oral)   Resp 18   Ht 6' 0.64" (1.845 m)   Wt 250 lb (113.4 kg)   SpO2 97%   BMI 33.31 kg/m   Physical Exam  Constitutional: He is oriented to person, place, and time. He appears well-developed and well-nourished. He does not appear ill.  HENT:  Head: Normocephalic and atraumatic.  Right Ear: There is drainage (dark brown cerumen blocking view of TM).  Left Ear: External ear and ear canal normal. Tympanic membrane is erythematous and retracted. Tympanic membrane is not bulging.  Nose:  Rhinorrhea (bilaterally) present. Right sinus exhibits no maxillary sinus tenderness and no frontal sinus tenderness. Left sinus exhibits no maxillary sinus tenderness and no frontal sinus tenderness.  Mouth/Throat: Uvula is midline and mucous membranes are normal. Posterior oropharyngeal erythema present. No posterior oropharyngeal edema. Tonsils are 2+ on the right. Tonsils are 2+ on the left. No tonsillar exudate.  Tonsils are erythematous bilaterally  Eyes: Conjunctivae are normal.  Neck: Normal range of motion.  Cardiovascular: Regular rhythm and normal heart sounds. Tachycardia present.  Pulmonary/Chest: Effort normal. He has no wheezes. He has no rales.  Coarse breath sounds auscultated in lung fields bilaterally.   Lymphadenopathy:       Head (right side): No submental, no submandibular, no tonsillar, no preauricular, no posterior auricular and no occipital adenopathy present.       Head (left side): No submental, no submandibular, no tonsillar, no preauricular, no posterior auricular and no occipital adenopathy present.    He has no cervical adenopathy.       Right: No supraclavicular adenopathy present.       Left: No supraclavicular adenopathy present.  Neurological: He is alert and oriented to person, place, and time.  Skin: Skin is warm and dry.  Psychiatric: He has a normal mood and affect.  Vitals reviewed.   No results found for this or any previous visit (from the past 24 hour(s)). Dg Chest 2 View  Result Date: 03/17/2018 CLINICAL DATA:  Abnormal breath sounds, history of tobacco use EXAM: CHEST - 2 VIEW COMPARISON:  01/11/2010  FINDINGS: The heart size and mediastinal contours are within normal limits. Both lungs are clear. The visualized skeletal structures are unremarkable. IMPRESSION: No active cardiopulmonary disease. Electronically Signed   By: Alcide Clever M.D.   On: 03/17/2018 16:54   Post ear lavage, canal is clear. TM is normal, no bulging or erythema.  Assessment  and Plan :  1. Cough - DG Chest 2 View; Future - benzonatate (TESSALON) 100 MG capsule; Take 1-2 capsules (100-200 mg total) by mouth 3 (three) times daily as needed for cough.  Dispense: 40 capsule; Refill: 0 - Dextromethorphan-Guaifenesin (MUCINEX DM MAXIMUM STRENGTH) 60-1200 MG TB12; Take 1 tablet by mouth every 12 (twelve) hours.  Dispense: 20 each; Refill: 0  2. Runny nose - azelastine (ASTELIN) 0.1 % nasal spray; Place 2 sprays into both nostrils 2 (two) times daily. Use in each nostril as directed  Dispense: 30 mL; Refill: 0  3. Sore throat 4. Acute upper respiratory infection CXR normal. Hx and PE findings consistent with URI. Likely viral etiology. Will tx symptomatically at this time.  Advised to return to clinic if symptoms worsen, do not improve, or as needed.  5. Impacted cerumen of right ear Resolved.  - Ear Lavage  Benjiman Core, PA-C  Primary Care at [facility] 03/17/2018 5:31 PM

## 2018-03-17 NOTE — Patient Instructions (Addendum)
- We will treat this as a respiratory viral infection.  - I recommend you rest, drink plenty of fluids, eat light meals including soups.  - You may use over the counter delsym cough syrup at night for your cough and sore throat, Tessalon pearls during the day to stop the cough and mucinex DM to help bring stuff up. - For runny nose, use nasal spray.  - You may also use Tylenol or ibuprofen over-the-counter for your sore throat and also over the counter cepacol lozenges. - Please let me know if you are not seeing any improvement or get worse in 5-7 days.   Upper Respiratory Infection, Adult Most upper respiratory infections (URIs) are caused by a virus. A URI affects the nose, throat, and upper air passages. The most common type of URI is often called "the common cold." Follow these instructions at home:  Take medicines only as told by your doctor.  Gargle warm saltwater or take cough drops to comfort your throat as told by your doctor.  Use a warm mist humidifier or inhale steam from a shower to increase air moisture. This may make it easier to breathe.  Drink enough fluid to keep your pee (urine) clear or pale yellow.  Eat soups and other clear broths.  Have a healthy diet.  Rest as needed.  Go back to work when your fever is gone or your doctor says it is okay. ? You may need to stay home longer to avoid giving your URI to others. ? You can also wear a face mask and wash your hands often to prevent spread of the virus.  Use your inhaler more if you have asthma.  Do not use any tobacco products, including cigarettes, chewing tobacco, or electronic cigarettes. If you need help quitting, ask your doctor. Contact a doctor if:  You are getting worse, not better.  Your symptoms are not helped by medicine.  You have chills.  You are getting more short of breath.  You have brown or red mucus.  You have yellow or brown discharge from your nose.  You have pain in your face,  especially when you bend forward.  You have a fever.  You have puffy (swollen) neck glands.  You have pain while swallowing.  You have white areas in the back of your throat. Get help right away if:  You have very bad or constant: ? Headache. ? Ear pain. ? Pain in your forehead, behind your eyes, and over your cheekbones (sinus pain). ? Chest pain.  You have long-lasting (chronic) lung disease and any of the following: ? Wheezing. ? Long-lasting cough. ? Coughing up blood. ? A change in your usual mucus.  You have a stiff neck.  You have changes in your: ? Vision. ? Hearing. ? Thinking. ? Mood. This information is not intended to replace advice given to you by your health care provider. Make sure you discuss any questions you have with your health care provider. Document Released: 06/04/2008 Document Revised: 08/19/2016 Document Reviewed: 03/24/2014 Elsevier Interactive Patient Education  2018 [employer].    IF you received an x-ray today, you will receive an invoice from [facility]. Please contact [facility] at [phone] with questions or concerns regarding your invoice.   IF you received labwork today, you will receive an invoice from [facility]. Please contact [facility] at [phone] with questions or concerns regarding your invoice.   Our billing staff will not be able to assist you with questions regarding bills  from these companies.  You will be contacted with the lab results as soon as they are available. The fastest way to get your results is to activate your My Chart account. Instructions are located on the last page of this paperwork. If you have not heard from [place] regarding the results in 2 weeks, please contact this office.

## 2018-04-13 ENCOUNTER — Other Ambulatory Visit: Payer: Self-pay | Admitting: Physician Assistant

## 2018-04-13 DIAGNOSIS — R0989 Other specified symptoms and signs involving the circulatory and respiratory systems: Secondary | ICD-10-CM

## 2018-04-14 NOTE — Telephone Encounter (Signed)
azelastine refill Last OV: 03/17/18 Last Refill:03/17/18 30 ml Pharmacy:[facility] [address] PCP: Benjiman Core PA-C

## 2018-12-24 ENCOUNTER — Encounter (HOSPITAL_COMMUNITY): Payer: Self-pay

## 2018-12-24 ENCOUNTER — Emergency Department (HOSPITAL_COMMUNITY): Payer: BLUE CROSS/BLUE SHIELD

## 2018-12-24 ENCOUNTER — Emergency Department (HOSPITAL_COMMUNITY)
Admission: EM | Admit: 2018-12-24 | Discharge: 2018-12-24 | Disposition: A | Discharge status: Home / Self Care | Payer: BLUE CROSS/BLUE SHIELD | Attending: Emergency Medicine | Admitting: Emergency Medicine

## 2018-12-24 ENCOUNTER — Other Ambulatory Visit: Payer: Self-pay

## 2018-12-24 DIAGNOSIS — Z79899 Other long term (current) drug therapy: Secondary | ICD-10-CM | POA: Diagnosis not present

## 2018-12-24 DIAGNOSIS — F17210 Nicotine dependence, cigarettes, uncomplicated: Secondary | ICD-10-CM | POA: Diagnosis not present

## 2018-12-24 DIAGNOSIS — R1032 Left lower quadrant pain: Secondary | ICD-10-CM | POA: Diagnosis present

## 2018-12-24 DIAGNOSIS — K529 Noninfective gastroenteritis and colitis, unspecified: Secondary | ICD-10-CM

## 2018-12-24 LAB — BASIC METABOLIC PANEL
Anion gap: 7 (ref 5–15)
BUN: 13 mg/dL (ref 6–20)
CO2: 27 mmol/L (ref 22–32)
Calcium: 9.1 mg/dL (ref 8.9–10.3)
Chloride: 105 mmol/L (ref 98–111)
Creatinine, Ser: 1.21 mg/dL (ref 0.61–1.24)
GFR calc Af Amer: >60 mL/min (ref >60)
GFR calc non Af Amer: >60 mL/min (ref >60)
GLUCOSE: 91 mg/dL (ref 70–99)
POTASSIUM: 4.1 mmol/L (ref 3.5–5.1)
Sodium: 139 mmol/L (ref 135–145)

## 2018-12-24 LAB — ABO/RH: ABO/RH(D): O POS

## 2018-12-24 LAB — CBC WITH DIFFERENTIAL/PLATELET
ABS IMMATURE GRANULOCYTES: 0.02 10*3/uL (ref 0.00–0.07)
Basophils Absolute: 0.1 10*3/uL (ref 0.0–0.1)
Basophils Relative: 1 %
Eosinophils Absolute: 0.1 10*3/uL (ref 0.0–0.5)
Eosinophils Relative: 2 %
HEMATOCRIT: 46.2 % (ref 39.0–52.0)
HEMOGLOBIN: 15.5 g/dL (ref 13.0–17.0)
IMMATURE GRANULOCYTES: 0 %
LYMPHS ABS: 2.6 10*3/uL (ref 0.7–4.0)
LYMPHS PCT: 33 %
MCH: 30.1 pg (ref 26.0–34.0)
MCHC: 33.5 g/dL (ref 30.0–36.0)
MCV: 89.7 fL (ref 80.0–100.0)
MONOS PCT: 12 %
Monocytes Absolute: 1 10*3/uL (ref 0.1–1.0)
NEUTROS ABS: 4.2 10*3/uL (ref 1.7–7.7)
NEUTROS PCT: 52 %
Platelets: 213 10*3/uL (ref 150–400)
RBC: 5.15 MIL/uL (ref 4.22–5.81)
RDW: 12.1 % (ref 11.5–15.5)
WBC: 7.9 10*3/uL (ref 4.0–10.5)
nRBC: 0 % (ref 0.0–0.2)

## 2018-12-24 LAB — TYPE AND SCREEN
ABO/RH(D): O POS
ANTIBODY SCREEN: NEGATIVE

## 2018-12-24 MED ORDER — IOPAMIDOL (ISOVUE-300) INJECTION 61%
INTRAVENOUS | Status: DC
Start: 2018-12-24 — End: 2018-12-24
  Filled 2018-12-24: qty 100

## 2018-12-24 MED ORDER — SODIUM CHLORIDE 0.9 % IV SOLN
INTRAVENOUS | Status: DC
  Administered 2018-12-24: 11:00:00 via INTRAVENOUS

## 2018-12-24 MED ORDER — SODIUM CHLORIDE (PF) 0.9 % IJ SOLN
INTRAMUSCULAR | Status: AC
  Filled 2018-12-24: qty 50

## 2018-12-24 MED ORDER — CIPROFLOXACIN HCL 500 MG PO TABS: 500.0000 mg | ORAL_TABLET | Freq: Two times a day (BID) | ORAL | 0 refills | Status: DC

## 2018-12-24 MED ORDER — IOPAMIDOL (ISOVUE-300) INJECTION 61%
100.0000 mL | Freq: Once | INTRAVENOUS | Status: AC | PRN
  Administered 2018-12-24: 100 mL via INTRAVENOUS

## 2018-12-24 MED ORDER — METRONIDAZOLE 500 MG PO TABS
500.0000 mg | ORAL_TABLET | Freq: Once | ORAL | Status: AC
  Administered 2018-12-24: 500 mg via ORAL
  Filled 2018-12-24: qty 1

## 2018-12-24 MED ORDER — CIPROFLOXACIN HCL 500 MG PO TABS
500.0000 mg | ORAL_TABLET | Freq: Once | ORAL | Status: AC
  Administered 2018-12-24: 500 mg via ORAL
  Filled 2018-12-24: qty 1

## 2018-12-24 MED ORDER — METRONIDAZOLE 500 MG PO TABS: 500.0000 mg | ORAL_TABLET | Freq: Two times a day (BID) | ORAL | 0 refills | Status: DC

## 2018-12-24 NOTE — ED Provider Notes (Signed)
[facility]-EMERGENCY DEPT Provider Note   CSN: [id number] Arrival date & time: 12/24/18  1017     History   Chief Complaint Chief Complaint  Patient presents with  . Rectal Bleeding    HPI Collin [name] is a 28 y.o. male.  28 year old male presents with bright red blood per rectum with some associated left lower quadrant abdominal pain times days.  Denies any fever or chills.  Pain has been persistent and without urinary symptoms.  No prior history of same.  No treatment used prior to arrival.  Patient notes he did have lots of popcorn several days ago.  Concerned about diverticulitis.     Past Medical History:  Diagnosis Date  . ADD (attention deficit disorder)   . Anxiety   . Bipolar 1 disorder (HCC)   . Depression   . Meningitis spinal     Patient Active Problem List   Diagnosis Date Noted  . Mood disorder (HCC) 05/02/2016  . Confusion 05/02/2016  . Hearing loss in right ear 05/02/2016    Past Surgical History:  Procedure Laterality Date  . NO PAST SURGERIES          Home Medications    Prior to Admission medications   Medication Sig Start Date End Date Taking? Authorizing Provider  amphetamine-dextroamphetamine (ADDERALL XR) 30 MG 24 hr capsule Take 30 mg by mouth daily.    [provider]  amphetamine-dextroamphetamine (ADDERALL) 5 MG tablet TAKE 1 TABLET BY MOUTH EVERY DAY AT LUNCH TIME FOR ADHD 04/08/16   [provider]  azelastine (ASTELIN) 0.1 % nasal spray USE 2 SPRAYS INTO BOTH NOSTRILS 2 TIMES DAILY AS DIRECTED 04/14/18   Ethelda ChickSmith, Kristi M, MD  benzonatate (TESSALON) 100 MG capsule Take 1-2 capsules (100-200 mg total) by mouth 3 (three) times daily as needed for cough. 03/17/18   Benjiman CoreWiseman, Brittany D, PA-C  Dextromethorphan-Guaifenesin (MUCINEX DM MAXIMUM STRENGTH) 60-1200 MG TB12 Take 1 tablet by mouth every 12 (twelve) hours. 03/17/18   Benjiman CoreWiseman, Brittany D, PA-C  lisdexamfetamine (VYVANSE) 60 MG capsule Take  60 mg by mouth every morning.    [provider]  sertraline (ZOLOFT) 50 MG tablet Take 1 tablet (50 mg total) by mouth daily. 03/08/15   Peyton NajjarHopper, David H, MD    Family History Family History  Problem Relation Age of Onset  . Hypertension Father   . Healthy Mother   . Stroke Paternal Uncle   . Heart disease Maternal Grandmother   . Heart disease Maternal Grandfather   . Stroke Paternal Grandfather     Social History Social History   Tobacco Use  . Smoking status: Current Every Day Smoker    Packs/day: 1.00    Years: 13.00    Pack years: 13.00    Types: Cigarettes  . Smokeless tobacco: Never Used  Substance Use Topics  . Alcohol use: Yes    Alcohol/week: 6.0 standard drinks    Types: 6 Cans of beer per week    Comment: 6 beers per week  . Drug use: Not Currently    Comment: Occasional marijuana use - 1-2 times per month     Allergies   Patient has no known allergies.   Review of Systems Review of Systems  All other systems reviewed and are negative.    Physical Exam Updated Vital Signs BP (!) 168/108 (BP Location: Right Arm)   Pulse (!) 112   Temp (!) 97.3 F (36.3 C) (Oral)   Resp 20   Ht  1.803 m (5\' 11" )   Wt 117.9 kg   SpO2 100%   BMI 36.26 kg/m   Physical Exam Vitals signs and nursing note reviewed.  Constitutional:      General: He is not in acute distress.    Appearance: Normal appearance. He is well-developed. He is not toxic-appearing.  HENT:     Head: Normocephalic and atraumatic.  Eyes:     General: Lids are normal.     Conjunctiva/sclera: Conjunctivae normal.     Pupils: Pupils are equal, round, and reactive to light.  Neck:     Musculoskeletal: Normal range of motion and neck supple.     Thyroid: No thyroid mass.     Trachea: No tracheal deviation.  Cardiovascular:     Rate and Rhythm: Normal rate and regular rhythm.     Heart sounds: Normal heart sounds. No murmur. No gallop.   Pulmonary:     Effort: Pulmonary effort is  normal. No respiratory distress.     Breath sounds: Normal breath sounds. No stridor. No decreased breath sounds, wheezing, rhonchi or rales.  Abdominal:     General: Bowel sounds are normal. There is no distension.     Palpations: Abdomen is soft.     Tenderness: There is abdominal tenderness in the left lower quadrant. There is no rebound.    Genitourinary:    Comments: Yellow stool on digital exam Musculoskeletal: Normal range of motion.        General: No tenderness.  Skin:    General: Skin is warm and dry.     Findings: No abrasion or rash.  Neurological:     Mental Status: He is alert and oriented to person, place, and time.     GCS: GCS eye subscore is 4. GCS verbal subscore is 5. GCS motor subscore is 6.     Cranial Nerves: No cranial nerve deficit.     Sensory: No sensory deficit.  Psychiatric:        Speech: Speech normal.        Behavior: Behavior normal.      ED Treatments / Results  Labs (all labs ordered are listed, but only abnormal results are displayed) Labs Reviewed  CBC WITH DIFFERENTIAL/PLATELET  BASIC METABOLIC PANEL  TYPE AND SCREEN    EKG None  Radiology No results found.  Procedures Procedures (including critical care time)  Medications Ordered in ED Medications  0.9 %  sodium chloride infusion (has no administration in time range)     Initial Impression / Assessment and Plan / ED Course  I have reviewed the triage vital signs and the nursing notes.  Pertinent labs & imaging results that were available during my care of the patient were reviewed by me and considered in my medical decision making (see chart for details).     Patient's hemoglobin stable at 15.5.  Abdominal CT consistent with colitis.  Patient treated with oral Cipro and Flagyl.  Will be placed on same and given referral to GI on call.  Final Clinical Impressions(s) / ED Diagnoses   Final diagnoses:  None    ED Discharge Orders    None       Lorre Nick,  MD 12/24/18 1311

## 2018-12-24 NOTE — ED Notes (Signed)
RX X 2 GIVEN 

## 2018-12-24 NOTE — ED Triage Notes (Signed)
Pt states that he ate a significant amount of popcorn 2 days ago, and felt constipated after. Pt states then yesterday he saw bright red blood. Pt states he has been having 2-3 BMs per hour. Pt states tight squeezing pain on LLQ.

## 2018-12-24 NOTE — ED Notes (Signed)
ED Provider at bedside. ALLEN 

## 2020-04-02 ENCOUNTER — Emergency Department (HOSPITAL_COMMUNITY): Payer: 59

## 2020-04-02 ENCOUNTER — Emergency Department (HOSPITAL_COMMUNITY)
Admission: EM | Admit: 2020-04-02 | Discharge: 2020-04-02 | Disposition: A | Discharge status: Home / Self Care | Payer: 59 | Attending: Emergency Medicine | Admitting: Emergency Medicine

## 2020-04-02 DIAGNOSIS — T50905A Adverse effect of unspecified drugs, medicaments and biological substances, initial encounter: Secondary | ICD-10-CM | POA: Insufficient documentation

## 2020-04-02 DIAGNOSIS — Z79899 Other long term (current) drug therapy: Secondary | ICD-10-CM | POA: Diagnosis not present

## 2020-04-02 DIAGNOSIS — F1920 Other psychoactive substance dependence, uncomplicated: Secondary | ICD-10-CM | POA: Insufficient documentation

## 2020-04-02 DIAGNOSIS — F17210 Nicotine dependence, cigarettes, uncomplicated: Secondary | ICD-10-CM | POA: Diagnosis not present

## 2020-04-02 DIAGNOSIS — R410 Disorientation, unspecified: Secondary | ICD-10-CM

## 2020-04-02 DIAGNOSIS — F1210 Cannabis abuse, uncomplicated: Secondary | ICD-10-CM | POA: Diagnosis not present

## 2020-04-02 DIAGNOSIS — F319 Bipolar disorder, unspecified: Secondary | ICD-10-CM | POA: Insufficient documentation

## 2020-04-02 DIAGNOSIS — R4182 Altered mental status, unspecified: Secondary | ICD-10-CM | POA: Diagnosis present

## 2020-04-02 LAB — CBC WITH DIFFERENTIAL/PLATELET
Abs Immature Granulocytes: 0.03 10*3/uL (ref 0.00–0.07)
Basophils Absolute: 0.1 10*3/uL (ref 0.0–0.1)
Basophils Relative: 1 %
Eosinophils Absolute: 0 10*3/uL (ref 0.0–0.5)
Eosinophils Relative: 0 %
HCT: 47.7 % (ref 39.0–52.0)
Hemoglobin: 16.1 g/dL (ref 13.0–17.0)
Immature Granulocytes: 0 %
Lymphocytes Relative: 13 %
Lymphs Abs: 1.3 10*3/uL (ref 0.7–4.0)
MCH: 29.7 pg (ref 26.0–34.0)
MCHC: 33.8 g/dL (ref 30.0–36.0)
MCV: 88 fL (ref 80.0–100.0)
Monocytes Absolute: 0.8 10*3/uL (ref 0.1–1.0)
Monocytes Relative: 8 %
Neutro Abs: 8.1 10*3/uL — ABNORMAL HIGH (ref 1.7–7.7)
Neutrophils Relative %: 78 %
Platelets: 247 10*3/uL (ref 150–400)
RBC: 5.42 MIL/uL (ref 4.22–5.81)
RDW: 11.9 % (ref 11.5–15.5)
WBC: 10.3 10*3/uL (ref 4.0–10.5)
nRBC: 0 % (ref 0.0–0.2)

## 2020-04-02 LAB — RAPID URINE DRUG SCREEN, HOSP PERFORMED
Amphetamines: POSITIVE — AB
Barbiturates: NOT DETECTED
Benzodiazepines: POSITIVE — AB
Cocaine: NOT DETECTED
Opiates: NOT DETECTED
Tetrahydrocannabinol: POSITIVE — AB

## 2020-04-02 LAB — COMPREHENSIVE METABOLIC PANEL
ALT: 51 U/L — ABNORMAL HIGH (ref 0–44)
AST: 34 U/L (ref 15–41)
Albumin: 4.7 g/dL (ref 3.5–5.0)
Alkaline Phosphatase: 67 U/L (ref 38–126)
Anion gap: 11 (ref 5–15)
BUN: 12 mg/dL (ref 6–20)
CO2: 25 mmol/L (ref 22–32)
Calcium: 9.3 mg/dL (ref 8.9–10.3)
Chloride: 104 mmol/L (ref 98–111)
Creatinine, Ser: 1.09 mg/dL (ref 0.61–1.24)
GFR calc Af Amer: >60 mL/min (ref >60)
GFR calc non Af Amer: >60 mL/min (ref >60)
Glucose, Bld: 129 mg/dL — ABNORMAL HIGH (ref 70–99)
Potassium: 4.4 mmol/L (ref 3.5–5.1)
Sodium: 140 mmol/L (ref 135–145)
Total Bilirubin: 0.7 mg/dL (ref 0.3–1.2)
Total Protein: 8.2 g/dL — ABNORMAL HIGH (ref 6.5–8.1)

## 2020-04-02 LAB — ACETAMINOPHEN LEVEL: Acetaminophen (Tylenol), Serum: <10 ug/mL — ABNORMAL LOW (ref 10–30)

## 2020-04-02 LAB — SALICYLATE LEVEL: Salicylate Lvl: <7 mg/dL — ABNORMAL LOW (ref 7.0–30.0)

## 2020-04-02 LAB — ETHANOL: Alcohol, Ethyl (B): <10 mg/dL (ref <10)

## 2020-04-02 MED ORDER — LACTATED RINGERS IV SOLN: INTRAVENOUS | Status: DC

## 2020-04-02 NOTE — ED Notes (Signed)
Patient stood up and took several steps without assistance. Patient given water per request.

## 2020-04-02 NOTE — ED Provider Notes (Signed)
[facility]   CSN: [id number] Arrival date & time: 04/02/20  9937     History Chief Complaint  Patient presents with  . Altered Mental Status    Collin [name] is a 30 y.o. male.  Patient is a 30 year old male with a history of bipolar disease, ADHD, depression presenting today with police and EMS for erratic behavior.  Patient was found wandering around outside in 25 degree temperatures with no clothes on and sweating profusely.  Police report patient stated he was on his way to get his medication from the pharmacy and was not making any sense.  Patient did report to the nurse that he had eaten meth.  Patient did receive 5 mg of Haldol and Versed due to combativeness when trying to transport.  Here patient is somnolent and will only occasionally open his eyes.  He is unable to provide any history.  Police reported he did struggle but there was no injury.  The history is provided by the EMS personnel. The history is limited by the condition of the patient.  Altered Mental Status Presenting symptoms: behavior changes   Severity:  Severe Most recent episode:  Today Episode history:  Continuous Timing:  Unable to specify Context: drug use        Past Medical History:  Diagnosis Date  . ADD (attention deficit disorder)   . Anxiety   . Bipolar 1 disorder (HCC)   . Depression   . Meningitis spinal     Patient Active Problem List   Diagnosis Date Noted  . Mood disorder (HCC) 05/02/2016  . Confusion 05/02/2016  . Hearing loss in right ear 05/02/2016    Past Surgical History:  Procedure Laterality Date  . NO PAST SURGERIES         Family History  Problem Relation Age of Onset  . Hypertension Father   . Healthy Mother   . Stroke Paternal Uncle   . Heart disease Maternal Grandmother   . Heart disease Maternal Grandfather   . Stroke Paternal Grandfather     Social History   Tobacco Use  . Smoking status: Current  Every Day Smoker    Packs/day: 1.00    Years: 13.00    Pack years: 13.00    Types: Cigarettes  . Smokeless tobacco: Never Used  Substance Use Topics  . Alcohol use: Yes    Alcohol/week: 6.0 standard drinks    Types: 6 Cans of beer per week    Comment: 6 beers per week  . Drug use: Not Currently    Comment: Occasional marijuana use - 1-2 times per month    Home Medications Prior to Admission medications   Medication Sig Start Date End Date Taking? Authorizing Provider  acetaminophen (TYLENOL) 500 MG tablet Take 500 mg by mouth every 6 (six) hours as needed for headache.    [provider]  azelastine (ASTELIN) 0.1 % nasal spray USE 2 SPRAYS INTO BOTH NOSTRILS 2 TIMES DAILY AS DIRECTED Patient not taking: Reported on 12/24/2018 04/14/18   Ethelda Chick, MD  benzonatate (TESSALON) 100 MG capsule Take 1-2 capsules (100-200 mg total) by mouth 3 (three) times daily as needed for cough. Patient not taking: Reported on 12/24/2018 03/17/18   Benjiman Core D, PA-C  ciprofloxacin (CIPRO) 500 MG tablet Take 1 tablet (500 mg total) by mouth 2 (two) times daily. 12/24/18   Lorre Nick, MD  Dextromethorphan-Guaifenesin (MUCINEX DM MAXIMUM STRENGTH) 60-1200 MG TB12 Take 1 tablet by mouth  every 12 (twelve) hours. Patient not taking: Reported on 12/24/2018 03/17/18   Benjiman Core D, PA-C  lisdexamfetamine (VYVANSE) 60 MG capsule Take 60 mg by mouth every morning.    [provider]  metroNIDAZOLE (FLAGYL) 500 MG tablet Take 1 tablet (500 mg total) by mouth 2 (two) times daily. 12/24/18   Lorre Nick, MD  sertraline (ZOLOFT) 50 MG tablet Take 1 tablet (50 mg total) by mouth daily. Patient not taking: Reported on 12/24/2018 03/08/15   Peyton Najjar, MD    Allergies    Patient has no known allergies.  Review of Systems   Review of Systems  Unable to perform ROS: Mental status change    Physical Exam Updated Vital Signs BP (!) 126/94 (BP Location: Right Arm)  Comment: Simultaneous filing. User may not have seen previous data.  Pulse (!) 128 Comment: Simultaneous filing. User may not have seen previous data.  Temp 98 F (36.7 C)   Resp 18 Comment: Simultaneous filing. User may not have seen previous data.  SpO2 98% Comment: Simultaneous filing. User may not have seen previous data.  Physical Exam Vitals and nursing note reviewed.  Constitutional:      General: He is not in acute distress.    Appearance: He is well-developed. He is obese.  HENT:     Head: Normocephalic and atraumatic.     Mouth/Throat:     Mouth: Mucous membranes are dry.  Eyes:     Conjunctiva/sclera: Conjunctivae normal.     Comments: Pupils are 5 mm bilaterally and briskly reactive  Cardiovascular:     Rate and Rhythm: Regular rhythm. Tachycardia present.     Pulses: Normal pulses.     Heart sounds: No murmur.  Pulmonary:     Effort: Pulmonary effort is normal. No respiratory distress.     Breath sounds: Normal breath sounds. No wheezing or rales.  Abdominal:     General: There is no distension.     Palpations: Abdomen is soft.     Tenderness: There is no abdominal tenderness. There is no guarding or rebound.  Musculoskeletal:        General: No tenderness. Normal range of motion.     Cervical back: Normal range of motion and neck supple.     Right lower leg: No edema.     Left lower leg: No edema.     Comments: No evidence of trauma  Skin:    General: Skin is warm and dry.     Capillary Refill: Capillary refill takes less than 2 seconds.     Findings: No erythema or rash.  Neurological:     Mental Status: He is lethargic.     Comments: Patient is somnolent however he will briefly open his eyes and mumble 1-2 words.  Difficult to complete a neurologic exam at this time due to patient's mental status.  Also patient is currently restrained in the bed  Psychiatric:     Comments: Somnolent at this time and hard to assess     ED Results / Procedures /  Treatments   Labs (all labs ordered are listed, but only abnormal results are displayed) Labs Reviewed  CBC WITH DIFFERENTIAL/PLATELET - Abnormal; Notable for the following components:      Result Value   Neutro Abs 8.1 (*)    All other components within normal limits  COMPREHENSIVE METABOLIC PANEL - Abnormal; Notable for the following components:   Glucose, Bld 129 (*)    Total Protein 8.2 (*)  ALT 51 (*)    All other components within normal limits  RAPID URINE DRUG SCREEN, HOSP PERFORMED - Abnormal; Notable for the following components:   Benzodiazepines POSITIVE (*)    Amphetamines POSITIVE (*)    Tetrahydrocannabinol POSITIVE (*)    All other components within normal limits  ACETAMINOPHEN LEVEL - Abnormal; Notable for the following components:   Acetaminophen (Tylenol), Serum <10 (*)    All other components within normal limits  SALICYLATE LEVEL - Abnormal; Notable for the following components:   Salicylate Lvl <8.5 (*)    All other components within normal limits  ETHANOL    EKG None   ED ECG REPORT   Date: 04/02/2020  Rate: 114  Rhythm: sinus tachycardia  QRS Axis: normal  Intervals: normal  ST/T Wave abnormalities: normal  Conduction Disutrbances:none  Narrative Interpretation:   Old EKG Reviewed: none available  I have personally reviewed the EKG tracing and agree with the computerized printout as noted.   Radiology DG Chest Port 1 View  Result Date: 04/02/2020 CLINICAL DATA:  Altered mental status. EXAM: PORTABLE CHEST 1 VIEW COMPARISON:  Chest x-ray dated 03/17/2018. FINDINGS: Heart size and mediastinal contours are within normal limits. Lungs are clear. No pleural effusion or pneumothorax is seen. Osseous structures about the chest are unremarkable. IMPRESSION: No active disease. No evidence of pneumonia or pulmonary edema. Electronically Signed   By: Franki Cabot M.D.   On: 04/02/2020 08:22    Procedures Procedures (including critical care  time)  Medications Ordered in ED Medications  lactated ringers infusion (has no administration in time range)    ED Course  I have reviewed the triage vital signs and the nursing notes.  Pertinent labs & imaging results that were available during my care of the patient were reviewed by me and considered in my medical decision making (see chart for details).    MDM Rules/Calculators/A&P                      Patient is a 30 year old male with suspicion for drug abuse resulting in him being outside naked in the cold weather with concern for excited delirium.  Patient did require Versed and Haldol for sedation due to being combative.  He did report he ate meth which could explain his tachycardia and dilated pupils.  Patient is intermittently desaturating to his low as the 70s but seems to be related to sleep apnea and quickly increases with deep breathing.  He was placed on 2 L of oxygen.  He is intermittently responding but suspicion that somnolence is due to medications.  No evidence of trauma and lower concern for acute head injury or stroke.  Labs including CBC, CMP, EtOH, acetaminophen, salicylate and UDS are pending.  EKG and chest x-ray are pending. We will continue to monitor the patient closely to ensure there is no decline in respiratory drive.  6:31 AM Patient is still sedated at this time however all of the blood work I ordered was reviewed and shows no significant abnormalities including CBC, CMP, EtOH, acetaminophen and salicylate levels.  UDS is still pending.  EKG was evaluated and interpreted and shows sinus tachycardia without other acute findings.  Chest x-ray was ordered, reviewed and interpreted without acute findings.  We will continue to monitor patient until mental status returns to normal.  1:00 PM UDS is positive for methamphetamines, marijuana and benzodiazepines.  Patient is now awake and alert requesting to call his parents to come pick him  up.  He has no acute  concerning medical issue at this time.  Feel he is safe for discharge.  Final Clinical Impression(s) / ED Diagnoses Final diagnoses:  Polysubstance (excluding opioids) dependence, daily use (HCC)  Delirium, drug-induced    Rx / DC Orders ED Discharge Orders    None       Gwyneth Sprout, MD 04/02/20 1302

## 2020-04-02 NOTE — ED Notes (Signed)
Patient ambulated to the restroom without assistance 

## 2020-04-02 NOTE — ED Triage Notes (Signed)
As Per [employer] and EMS pt was found walking down street with no pants or underware. Pt was combative and attempted to run form police. Pt states he took meth. Pt was given halodol and versed.

## 2021-01-12 DIAGNOSIS — Z8349 Family history of other endocrine, nutritional and metabolic diseases: Secondary | ICD-10-CM | POA: Diagnosis not present

## 2021-01-12 DIAGNOSIS — B370 Candidal stomatitis: Secondary | ICD-10-CM | POA: Diagnosis not present

## 2021-01-12 DIAGNOSIS — Z202 Contact with and (suspected) exposure to infections with a predominantly sexual mode of transmission: Secondary | ICD-10-CM | POA: Diagnosis not present

## 2021-01-12 DIAGNOSIS — J039 Acute tonsillitis, unspecified: Secondary | ICD-10-CM | POA: Diagnosis not present

## 2021-02-10 DIAGNOSIS — B20 Human immunodeficiency virus [HIV] disease: Secondary | ICD-10-CM | POA: Diagnosis not present

## 2021-02-10 DIAGNOSIS — L219 Seborrheic dermatitis, unspecified: Secondary | ICD-10-CM | POA: Diagnosis not present

## 2021-02-10 DIAGNOSIS — L738 Other specified follicular disorders: Secondary | ICD-10-CM | POA: Diagnosis not present

## 2021-02-28 DIAGNOSIS — Z23 Encounter for immunization: Secondary | ICD-10-CM | POA: Diagnosis not present

## 2021-02-28 DIAGNOSIS — B20 Human immunodeficiency virus [HIV] disease: Secondary | ICD-10-CM | POA: Diagnosis not present

## 2021-02-28 DIAGNOSIS — Z13220 Encounter for screening for lipoid disorders: Secondary | ICD-10-CM | POA: Diagnosis not present

## 2021-02-28 DIAGNOSIS — R748 Abnormal levels of other serum enzymes: Secondary | ICD-10-CM | POA: Diagnosis not present

## 2021-03-24 DIAGNOSIS — F3132 Bipolar disorder, current episode depressed, moderate: Secondary | ICD-10-CM | POA: Diagnosis not present

## 2021-03-24 DIAGNOSIS — F902 Attention-deficit hyperactivity disorder, combined type: Secondary | ICD-10-CM | POA: Diagnosis not present

## 2021-05-05 DIAGNOSIS — B20 Human immunodeficiency virus [HIV] disease: Secondary | ICD-10-CM | POA: Diagnosis not present

## 2021-05-19 DIAGNOSIS — F3132 Bipolar disorder, current episode depressed, moderate: Secondary | ICD-10-CM | POA: Diagnosis not present

## 2021-05-19 DIAGNOSIS — F902 Attention-deficit hyperactivity disorder, combined type: Secondary | ICD-10-CM | POA: Diagnosis not present

## 2021-06-01 DIAGNOSIS — R748 Abnormal levels of other serum enzymes: Secondary | ICD-10-CM | POA: Diagnosis not present

## 2021-06-23 IMAGING — DX DG CHEST 1V PORT
1 series · 1 of 1 positions shown · non-contrast
Comparison: Chest x-ray dated 03/17/2018.

CLINICAL DATA: Altered mental status.

EXAM:
PORTABLE CHEST 1 VIEW

[chest ap]
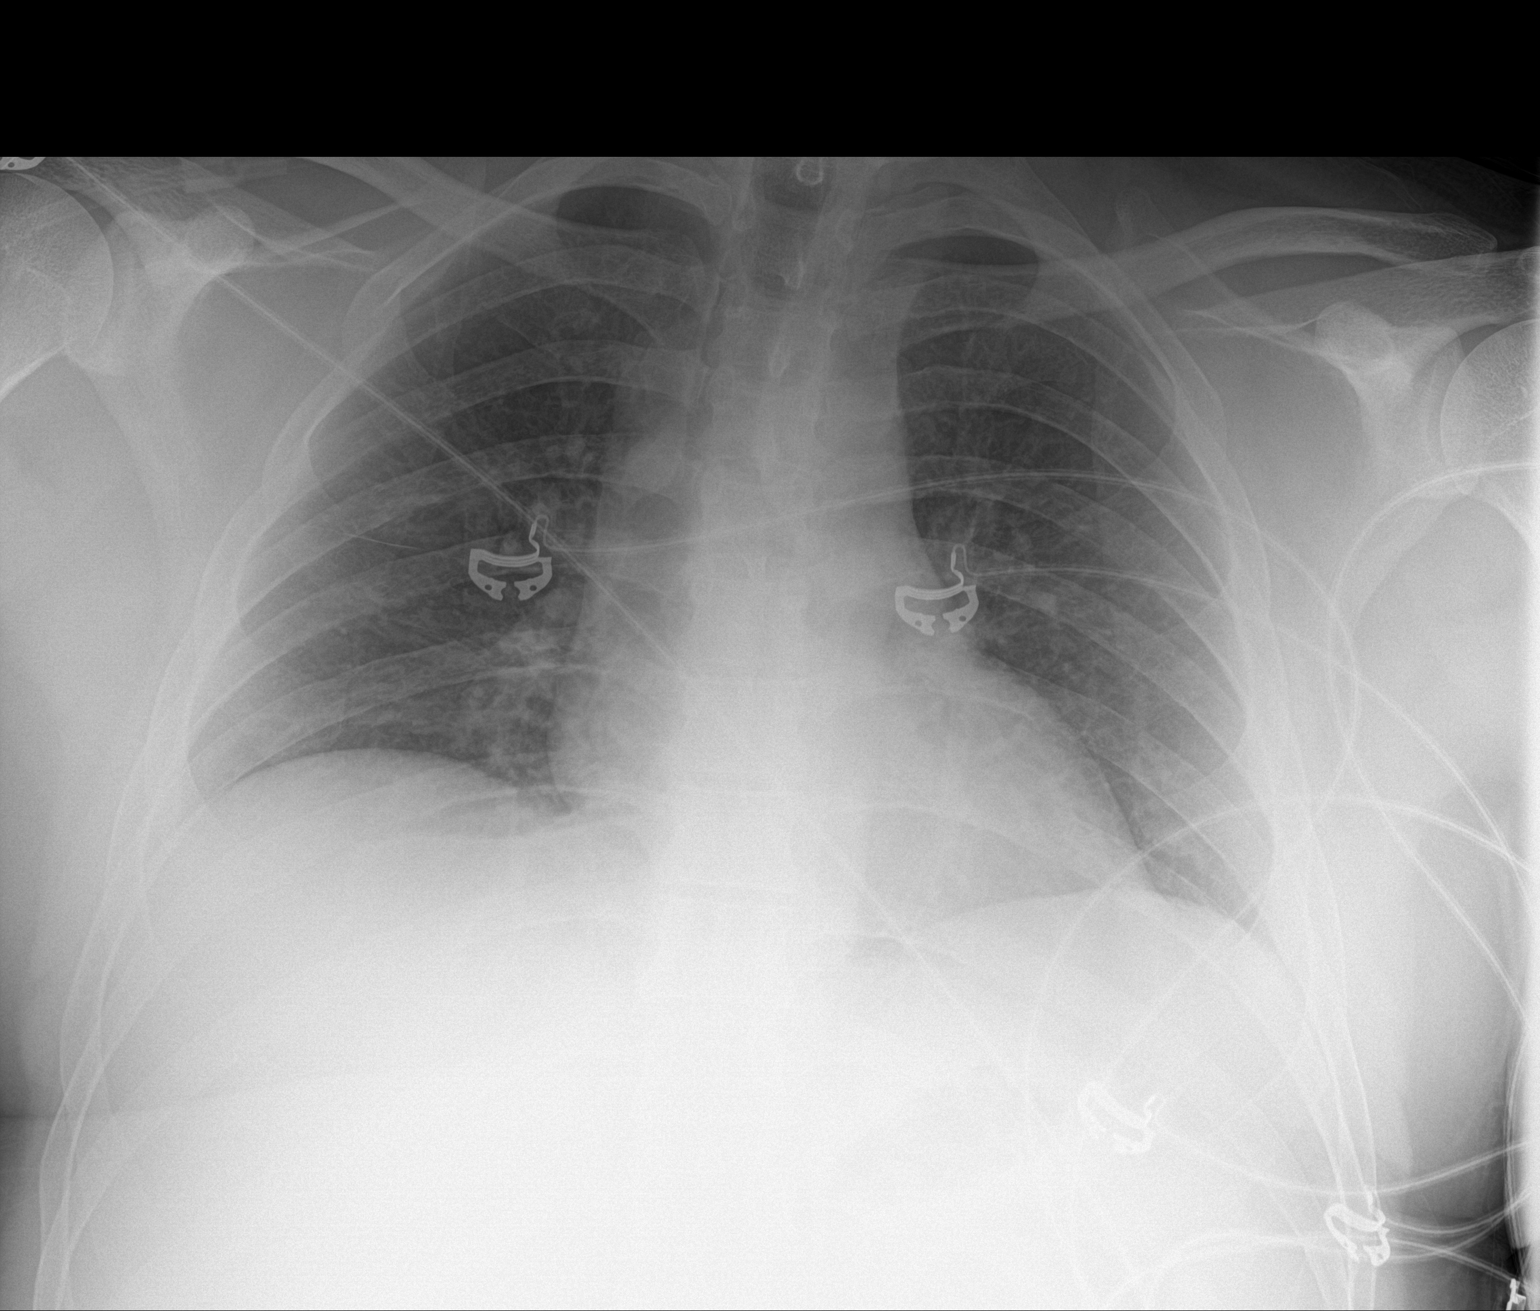

[1 of 1 positions shown; findings below may reference images not displayed]

FINDINGS: Heart size and mediastinal contours are within normal limits. Lungs
are clear. No pleural effusion or pneumothorax is seen. Osseous
structures about the chest are unremarkable.
IMPRESSION: No active disease. No evidence of pneumonia or pulmonary edema.

## 2021-08-08 DIAGNOSIS — F3132 Bipolar disorder, current episode depressed, moderate: Secondary | ICD-10-CM | POA: Diagnosis not present

## 2021-08-08 DIAGNOSIS — F902 Attention-deficit hyperactivity disorder, combined type: Secondary | ICD-10-CM | POA: Diagnosis not present

## 2021-09-05 DIAGNOSIS — R079 Chest pain, unspecified: Secondary | ICD-10-CM | POA: Diagnosis not present

## 2021-09-05 DIAGNOSIS — H539 Unspecified visual disturbance: Secondary | ICD-10-CM | POA: Diagnosis not present

## 2021-09-05 DIAGNOSIS — R42 Dizziness and giddiness: Secondary | ICD-10-CM | POA: Diagnosis not present

## 2021-09-05 DIAGNOSIS — E8801 Alpha-1-antitrypsin deficiency: Secondary | ICD-10-CM | POA: Diagnosis not present

## 2021-09-06 DIAGNOSIS — R079 Chest pain, unspecified: Secondary | ICD-10-CM | POA: Diagnosis not present

## 2021-10-30 DIAGNOSIS — E8801 Alpha-1-antitrypsin deficiency: Secondary | ICD-10-CM | POA: Diagnosis not present

## 2021-10-30 DIAGNOSIS — R0683 Snoring: Secondary | ICD-10-CM | POA: Diagnosis not present

## 2021-11-12 DIAGNOSIS — U071 COVID-19: Secondary | ICD-10-CM | POA: Diagnosis not present

## 2021-11-13 DIAGNOSIS — F902 Attention-deficit hyperactivity disorder, combined type: Secondary | ICD-10-CM | POA: Diagnosis not present

## 2021-11-13 DIAGNOSIS — F3132 Bipolar disorder, current episode depressed, moderate: Secondary | ICD-10-CM | POA: Diagnosis not present

## 2022-02-09 DIAGNOSIS — F902 Attention-deficit hyperactivity disorder, combined type: Secondary | ICD-10-CM | POA: Diagnosis not present

## 2022-02-09 DIAGNOSIS — F3132 Bipolar disorder, current episode depressed, moderate: Secondary | ICD-10-CM | POA: Diagnosis not present

## 2022-05-09 DIAGNOSIS — F902 Attention-deficit hyperactivity disorder, combined type: Secondary | ICD-10-CM | POA: Diagnosis not present

## 2022-05-09 DIAGNOSIS — F3132 Bipolar disorder, current episode depressed, moderate: Secondary | ICD-10-CM | POA: Diagnosis not present

## 2022-08-25 DIAGNOSIS — F902 Attention-deficit hyperactivity disorder, combined type: Secondary | ICD-10-CM | POA: Diagnosis not present

## 2022-08-25 DIAGNOSIS — F3132 Bipolar disorder, current episode depressed, moderate: Secondary | ICD-10-CM | POA: Diagnosis not present

## 2022-11-06 DIAGNOSIS — F3132 Bipolar disorder, current episode depressed, moderate: Secondary | ICD-10-CM | POA: Diagnosis not present

## 2022-11-06 DIAGNOSIS — F902 Attention-deficit hyperactivity disorder, combined type: Secondary | ICD-10-CM | POA: Diagnosis not present

## 2022-11-08 ENCOUNTER — Other Ambulatory Visit: Payer: BC Managed Care – PPO

## 2022-11-08 ENCOUNTER — Other Ambulatory Visit: Payer: Self-pay

## 2022-11-08 DIAGNOSIS — Z79899 Other long term (current) drug therapy: Secondary | ICD-10-CM

## 2022-11-08 DIAGNOSIS — Z113 Encounter for screening for infections with a predominantly sexual mode of transmission: Secondary | ICD-10-CM | POA: Diagnosis not present

## 2022-11-08 DIAGNOSIS — B20 Human immunodeficiency virus [HIV] disease: Secondary | ICD-10-CM

## 2022-11-09 LAB — T-HELPER CELL (CD4) - (RCID CLINIC ONLY)
CD4 % Helper T Cell: 48 % (ref 33–65)
CD4 T Cell Abs: 1651 /uL (ref 400–1790)

## 2022-11-13 LAB — LIPID PANEL
Cholesterol: 186 mg/dL (ref <200)
HDL: 60 mg/dL (ref >=40)
LDL Cholesterol (Calc): 105 mg/dL (calc) — ABNORMAL HIGH
Non-HDL Cholesterol (Calc): 126 mg/dL (calc) (ref <130)
Total CHOL/HDL Ratio: 3.1 (calc) (ref <5.0)
Triglycerides: 110 mg/dL (ref <150)

## 2022-11-13 LAB — COMPLETE METABOLIC PANEL WITH GFR
AG Ratio: 1.5 (calc) (ref 1.0–2.5)
ALT: 47 U/L — ABNORMAL HIGH (ref 9–46)
AST: 28 U/L (ref 10–40)
Albumin: 4.6 g/dL (ref 3.6–5.1)
Alkaline phosphatase (APISO): 86 U/L (ref 36–130)
BUN: 14 mg/dL (ref 7–25)
CO2: 24 mmol/L (ref 20–32)
Calcium: 9.8 mg/dL (ref 8.6–10.3)
Chloride: 104 mmol/L (ref 98–110)
Creat: 0.98 mg/dL (ref 0.60–1.26)
Globulin: 3.1 g/dL (calc) (ref 1.9–3.7)
Glucose, Bld: 114 mg/dL — ABNORMAL HIGH (ref 65–99)
Potassium: 3.8 mmol/L (ref 3.5–5.3)
Sodium: 140 mmol/L (ref 135–146)
Total Bilirubin: 0.6 mg/dL (ref 0.2–1.2)
Total Protein: 7.7 g/dL (ref 6.1–8.1)
eGFR: 104 mL/min/{1.73_m2} (ref >=60)

## 2022-11-13 LAB — CBC WITH DIFFERENTIAL/PLATELET
Absolute Monocytes: 628 cells/uL (ref 200–950)
Basophils Absolute: 55 cells/uL (ref 0–200)
Basophils Relative: 0.6 %
Eosinophils Absolute: 55 cells/uL (ref 15–500)
Eosinophils Relative: 0.6 %
HCT: 46.1 % (ref 38.5–50.0)
Hemoglobin: 16 g/dL (ref 13.2–17.1)
Lymphs Abs: 3931 cells/uL — ABNORMAL HIGH (ref 850–3900)
MCH: 30.4 pg (ref 27.0–33.0)
MCHC: 34.7 g/dL (ref 32.0–36.0)
MCV: 87.6 fL (ref 80.0–100.0)
MPV: 10.6 fL (ref 7.5–12.5)
Monocytes Relative: 6.9 %
Neutro Abs: 4432 cells/uL (ref 1500–7800)
Neutrophils Relative %: 48.7 %
Platelets: 291 10*3/uL (ref 140–400)
RBC: 5.26 10*6/uL (ref 4.20–5.80)
RDW: 12.6 % (ref 11.0–15.0)
Total Lymphocyte: 43.2 %
WBC: 9.1 10*3/uL (ref 3.8–10.8)

## 2022-11-13 LAB — HIV-1 RNA ULTRAQUANT REFLEX TO GENTYP+
HIV 1 RNA Quant: NOT DETECTED copies/mL
HIV-1 RNA Quant, Log: NOT DETECTED Log copies/mL

## 2022-11-13 LAB — HEPATITIS B SURFACE ANTIGEN
Confirmation: NONREACTIVE
Hepatitis B Surface Ag: REACTIVE — AB

## 2022-11-13 LAB — RPR: RPR Ser Ql: NONREACTIVE

## 2022-11-13 LAB — QUANTIFERON-TB GOLD PLUS
Mitogen-NIL: >10 IU/mL
NIL: 0.24 IU/mL
QuantiFERON-TB Gold Plus: NEGATIVE
TB1-NIL: <0 IU/mL
TB2-NIL: <0 IU/mL

## 2022-11-13 LAB — HEPATITIS B SURFACE ANTIBODY,QUALITATIVE: Hep B S Ab: REACTIVE — AB

## 2022-11-13 LAB — HEPATITIS A ANTIBODY, TOTAL: Hepatitis A AB,Total: REACTIVE — AB

## 2022-11-13 LAB — HEPATITIS B CORE ANTIBODY, TOTAL: Hep B Core Total Ab: NONREACTIVE

## 2022-11-13 LAB — HLA B*5701: HLA-B*5701 w/rflx HLA-B High: NEGATIVE

## 2022-11-13 LAB — HEPATITIS C ANTIBODY: Hepatitis C Ab: NONREACTIVE

## 2022-11-29 ENCOUNTER — Ambulatory Visit: Payer: 59 | Admitting: Internal Medicine

## 2022-11-29 ENCOUNTER — Ambulatory Visit: Payer: 59 | Admitting: Pharmacist

## 2022-12-04 ENCOUNTER — Other Ambulatory Visit (HOSPITAL_COMMUNITY): Payer: Self-pay

## 2022-12-04 ENCOUNTER — Telehealth: Payer: Self-pay

## 2022-12-04 NOTE — Telephone Encounter (Signed)
[facility] Patient Product/process development scientist completed.    The patient is insured through [facility] and has a $35.00 copay.  Patient will need a copay coupon card to make copay $0.00  We will continue to follow to see if copay assistance is needed.  Clearance Coots, CPhT Specialty Pharmacy Patient [facility] Phone: [phone] Fax:  [phone]

## 2022-12-06 ENCOUNTER — Ambulatory Visit: Payer: BC Managed Care – PPO | Admitting: Internal Medicine

## 2022-12-06 ENCOUNTER — Ambulatory Visit: Payer: BC Managed Care – PPO | Admitting: Pharmacist

## 2022-12-13 ENCOUNTER — Encounter: Payer: Self-pay | Admitting: Internal Medicine

## 2022-12-13 ENCOUNTER — Ambulatory Visit (INDEPENDENT_AMBULATORY_CARE_PROVIDER_SITE_OTHER): Payer: BC Managed Care – PPO | Admitting: Pharmacist

## 2022-12-13 ENCOUNTER — Ambulatory Visit (INDEPENDENT_AMBULATORY_CARE_PROVIDER_SITE_OTHER): Payer: BC Managed Care – PPO

## 2022-12-13 ENCOUNTER — Other Ambulatory Visit: Payer: Self-pay

## 2022-12-13 ENCOUNTER — Ambulatory Visit: Payer: BC Managed Care – PPO | Admitting: Internal Medicine

## 2022-12-13 VITALS — BP 140/94 | HR 116 | Temp 97.5°F | Wt 251.0 lb

## 2022-12-13 DIAGNOSIS — E8801 Alpha-1-antitrypsin deficiency: Secondary | ICD-10-CM

## 2022-12-13 DIAGNOSIS — Z23 Encounter for immunization: Secondary | ICD-10-CM

## 2022-12-13 DIAGNOSIS — B180 Chronic viral hepatitis B with delta-agent: Secondary | ICD-10-CM

## 2022-12-13 DIAGNOSIS — F988 Other specified behavioral and emotional disorders with onset usually occurring in childhood and adolescence: Secondary | ICD-10-CM

## 2022-12-13 DIAGNOSIS — B20 Human immunodeficiency virus [HIV] disease: Secondary | ICD-10-CM

## 2022-12-13 DIAGNOSIS — Z113 Encounter for screening for infections with a predominantly sexual mode of transmission: Secondary | ICD-10-CM

## 2022-12-13 MED ORDER — BICTEGRAVIR-EMTRICITAB-TENOFOV 50-200-25 MG PO TABS: 1.0000 | ORAL_TABLET | Freq: Every day | ORAL | 11 refills | Status: DC

## 2022-12-13 NOTE — Progress Notes (Signed)
HPI: Loc Collin [name] is a 32 y.o. male who presents to the [facility] clinic today to transfer care for HIV.  Patient Active Problem List   Diagnosis Date Noted   Mood disorder ([place]) 05/02/2016   Confusion 05/02/2016   Hearing loss in right ear 05/02/2016    Patient's Medications  New Prescriptions   BICTEGRAVIR-EMTRICITABINE-TENOFOVIR AF (BIKTARVY) 50-200-25 MG TABS TABLET    Take 1 tablet by mouth daily.  Previous Medications   ACETAMINOPHEN (TYLENOL) 500 MG TABLET    Take 500 mg by mouth every 6 (six) hours as needed for headache.   AZELASTINE (ASTELIN) 0.1 % NASAL SPRAY    USE 2 SPRAYS INTO BOTH NOSTRILS 2 TIMES DAILY AS DIRECTED   BENZONATATE (TESSALON) 100 MG CAPSULE    Take 1-2 capsules (100-200 mg total) by mouth 3 (three) times daily as needed for cough.   CIPROFLOXACIN (CIPRO) 500 MG TABLET    Take 1 tablet (500 mg total) by mouth 2 (two) times daily.   DEXTROMETHORPHAN-GUAIFENESIN (MUCINEX DM MAXIMUM STRENGTH) 60-1200 MG TB12    Take 1 tablet by mouth every 12 (twelve) hours.   LISDEXAMFETAMINE (VYVANSE) 60 MG CAPSULE    Take 70 mg by mouth every morning.   METRONIDAZOLE (FLAGYL) 500 MG TABLET    Take 1 tablet (500 mg total) by mouth 2 (two) times daily.   SERTRALINE (ZOLOFT) 50 MG TABLET    Take 1 tablet (50 mg total) by mouth daily.  Modified Medications   No medications on file  Discontinued Medications   No medications on file    Allergies: No Known Allergies  Past Medical History: Past Medical History:  Diagnosis Date   ADD (attention deficit disorder)    Anxiety    Bipolar 1 disorder (HCC)    Depression    Meningitis spinal     Social History: Social History   Socioeconomic History   Marital status: Single    Spouse name: Not on file   Number of children: 0   Years of education: Bachelors   Highest education level: Not on file  Occupational History   Occupation: Student    Comment: Graduates May 2017 - History/Philosophy  Tobacco Use   Smoking  status: Every Day    Packs/day: 1.00    Years: 13.00    Total pack years: 13.00    Types: Cigarettes   Smokeless tobacco: Never  Vaping Use   Vaping Use: Never used  Substance and Sexual Activity   Alcohol use: Yes    Alcohol/week: 3.0 standard drinks of alcohol    Types: 3 Cans of beer per week    Comment: 15 beers per week   Drug use: Not Currently    Comment: Occasional marijuana use - 1-2 times per month   Sexual activity: Yes    Birth control/protection: Condom, None    Comment: With women and men  Other Topics Concern   Not on file  Social History Narrative   Lives at home with parents.   Right-handed.   2 caffeinated drinks per day.   Social Determinants of Health   Financial Resource Strain: Not on file  Food Insecurity: Not on file  Transportation Needs: Not on file  Physical Activity: Not on file  Stress: Not on file  Social Connections: Not on file    Labs: Lab Results  Component Value Date   HIV1RNAQUANT NOT DETECTED 11/08/2022   HIV1RNAQUANT <20 11/17/2014   CD4TABS 1,651 11/08/2022    RPR and STI Lab  Results  Component Value Date   LABRPR NON-REACTIVE 11/08/2022   LABRPR NON REAC 03/08/2015   LABRPR NON REAC 11/17/2014    STI Results GC CT  03/31/2015 10:24 PM NEGATIVE  NEGATIVE     Hepatitis B Lab Results  Component Value Date   HEPBSAB REACTIVE (A) 11/08/2022   HEPBSAG REACTIVE (A) 11/08/2022   HEPBCAB NON-REACTIVE 11/08/2022   Hepatitis C Lab Results  Component Value Date   HEPCAB NON-REACTIVE 11/08/2022   Hepatitis A Lab Results  Component Value Date   HAV REACTIVE (A) 11/08/2022   Lipids: Lab Results  Component Value Date   CHOL 186 11/08/2022   TRIG 110 11/08/2022   HDL 60 11/08/2022   CHOLHDL 3.1 11/08/2022   LDLCALC 105 (H) 11/08/2022    Current HIV Regimen: Biktarvy  Assessment: [name] is here today to transfer care with Dr. Gale Journey for chronic HIV infection. Met with patient today and explained the clinic's  pharmacy services.  Patient is taking Biktarvy and tolerating it without any issues.  No side effects or missed doses noted. He will continue filling through [facility] in [place]. Gave patient my card and told him to call me with any issues/questions/concerns in the future.  Plan: Continue Biktarvy  Alfonse Spruce, PharmD, CPP, BCIDP, [place] Clinical Pharmacist Practitioner Infectious Diseases [place] 12/13/2022, 3:18 PM

## 2022-12-13 NOTE — Progress Notes (Signed)
Collin [name] for Infectious Disease  Reason for Consult:hiv b20 intake Referring Provider: self    Patient Active Problem List   Diagnosis Date Noted   Mood disorder ([place]) 05/02/2016   Confusion 05/02/2016   Hearing loss in right ear 05/02/2016      HPI: Collin [name] is a 32 y.o. male hiv here for a new b20 intake   12/13/22 initial visit at [facility]. I reviewed care everywhere record from [facility] #hiv Dx'ed 12/2020 at [place]; [facility]; he lives closer to [facility] going there since summer 2023 but his psychiatrist has been prescribing his biktarvy. He initially saw pcp for alpha-1-deficiency screenign due to family hx, but has hiv screening done as well. He has uri sx at that time Cd4 nadir >200 Current therapy 01/2021-c biktarvy  Compliant; no missed dose last 4 weeks Well controlled    #add  #?bipolar Seeing psychiatry and previously on bipolar but no difference in how he feels -- sertraline, abilify Just taking add med right now (vivance) No si/hi/depression/anxiety No current mania -- period of time when he was on adderal when he had some mania   #acute hep b infection? [facility] went to [facility] and as part of intake hepatitis b screening was done and was positive for surface antigen and surface antibody but negative for core antibody No n/v/diarrhea/rash/joint pain within the past few months   #heterozygous alpha 1 antitrypsin deficiency He had seen [facility] doc once but hasn't followed up He hasn't seen gi doc He wants to be seen here   #social Born in [place], [place]; no outside of [place] travel hx; not been to [place] or [place] No ivdu, illicits; does use alcohol and marijuana Lives with parents in [place] sexually active -- 09/2022; not in a relationship Patient works as Investment banker, corporate Have a couple cats No particular hobbies    Review of Systems: ROS All other ros  negative      Past Medical History:  Diagnosis Date   ADD (attention deficit disorder)    Anxiety    Bipolar 1 disorder ([place])    Depression    Meningitis spinal     Social History   Tobacco Use   Smoking status: Every Day    Packs/day: 1.00    Years: 13.00    Total pack years: 13.00    Types: Cigarettes   Smokeless tobacco: Never  Vaping Use   Vaping Use: Never used  Substance Use Topics   Alcohol use: Yes    Alcohol/week: 3.0 standard drinks of alcohol    Types: 3 Cans of beer per week    Comment: 15 beers per week   Drug use: Not Currently    Comment: Occasional marijuana use - 1-2 times per month    Family History  Problem Relation Age of Onset   Hypertension Father    Healthy Mother    Stroke Paternal Uncle    Heart disease Maternal Grandmother    Heart disease Maternal Grandfather    Stroke Paternal Grandfather     No Known Allergies  OBJECTIVE: Vitals:   12/13/22 1356  BP: (!) 140/94  Pulse: (!) 116  Temp: (!) 97.5 F (36.4 C)  TempSrc: Oral  SpO2: 94%  Weight: 251 lb (113.9 kg)   Body mass index is 35.01 kg/m.   Physical Exam General/constitutional: no distress, pleasant HEENT: Normocephalic, PER, Conj Clear, EOMI, Oropharynx  clear Neck supple CV: rrr no mrg Lungs: clear to auscultation, normal respiratory effort Abd: Soft, Nontender Ext: no edema Skin: No Rash Neuro: nonfocal MSK: no peripheral joint swelling/tenderness/warmth; back spines nontender   Lab: Lab Results  Component Value Date   WBC 9.1 11/08/2022   HGB 16.0 11/08/2022   HCT 46.1 11/08/2022   MCV 87.6 11/08/2022   PLT 291 46/56/8127   Last metabolic panel Lab Results  Component Value Date   GLUCOSE 114 (H) 11/08/2022   NA 140 11/08/2022   K 3.8 11/08/2022   CL 104 11/08/2022   CO2 24 11/08/2022   BUN 14 11/08/2022   CREATININE 0.98 11/08/2022   EGFR 104 11/08/2022   CALCIUM 9.8 11/08/2022   PROT 7.7 11/08/2022   ALBUMIN 4.7 04/02/2020   LABGLOB 2.8  05/02/2016   AGRATIO 1.8 05/02/2016   BILITOT 0.6 11/08/2022   ALKPHOS 67 04/02/2020   AST 28 11/08/2022   ALT 47 (H) 11/08/2022   ANIONGAP 11 04/02/2020    Hiv: 11/08/22        <20       /          Microbiology:  Serology: 11/08/22 hep c ab nonreactive; hep a Ab reactive; hep b sAg positive and sAb reactive and cAb nonreactive  Imaging:   Assessment/plan: Problem List Items Addressed This Visit   None Visit Diagnoses     HIV disease ([place])    -  Primary   Relevant Medications   bictegravir-emtricitabine-tenofovir AF (BIKTARVY) 50-200-25 MG TABS tablet   Other Relevant Orders   Comprehensive metabolic panel   CBC   HIV-1 RNA quant-no reflex-bld   Chronic viral hepatitis B without coma and with delta agent (HCC)       Relevant Medications   bictegravir-emtricitabine-tenofovir AF (BIKTARVY) 50-200-25 MG TABS tablet   Other Relevant Orders   Hepatitis B core antibody, total   Hepatitis B DNA, ultraquantitative, PCR   Hepatitis B e antibody   Hepatitis B e antigen   Hepatitis B surface antigen   Hepatitis C antibody   Hepatitis B surface antibody,quantitative   Alpha-1-antitrypsin deficiency ([place])       Relevant Orders   Ambulatory referral to Pulmonology   Ambulatory referral to Gastroenterology   Attention deficit disorder, unspecified hyperactivity presence       Screening for STDs (sexually transmitted diseases)       Relevant Orders   RPR   Urine cytology ancillary only([place])   Cytology (oral, anal, urethral) ancillary only   Cytology (oral, anal, urethral) ancillary only      #hiv [facility]; dx'ed acute retroviral syndrome 12/2020? Started on biktarvy within 1-2 months after dx. Well controlled   He inquires about q32month injection which I told him at this time cabenuva is the only currently approved for controlled hiv patient and q276month The lenacapavir medication is not ready yet for his type of patient population   -discussed u=u -encourage  compliance -continue current HIV medication -labs 6 months 2 weeks prior to f/u    #hep b ?acute infection Serology profile a little atypical as would expect core ab to be positive too. Hopefully his current controlled hiv he'll clear this infection on his own  -repeat testing in 6 months   #std screening He is assyptomatic and want to defer testing today till next visit Advise condom use if sexually active   #add F/u his psychiatrist   #alpha1antitripsin Heterozygous? No obvious lung/liver disease at this time  -referral  placed to pulm/gi for ongoing monitoring    #hcm -vaccination -hepatitis -std screening -tb screening 10/2022 quantiferon negative -cancer screening  I have spent a total of 65 minutes of face-to-face and non-face-to-face time, excluding clinical staff time, preparing to see patient, ordering tests and/or medications, and provide counseling the patient      Follow-up: Return in about 6 months (around 06/14/2023).  Jabier Mutton, [place] [facility] 12/13/2022, 2:08 PM

## 2022-12-13 NOTE — Patient Instructions (Addendum)
Please setup a primary care provider -- internal medicine clinic/residency clinic here at [facility]   Referral to lung and GI doctor placed for ongoing monitoring of alpha-1-antitrypsin deficiency   Continue bikarvy   See me in 6 months to repeat testing on hepatitis b. Schedule lab visit for may 2024 to do labs and see me 2 weeks after that

## 2023-02-01 DIAGNOSIS — F902 Attention-deficit hyperactivity disorder, combined type: Secondary | ICD-10-CM | POA: Diagnosis not present

## 2023-02-01 DIAGNOSIS — F3132 Bipolar disorder, current episode depressed, moderate: Secondary | ICD-10-CM | POA: Diagnosis not present

## 2023-04-30 DIAGNOSIS — F902 Attention-deficit hyperactivity disorder, combined type: Secondary | ICD-10-CM | POA: Diagnosis not present

## 2023-04-30 DIAGNOSIS — F3132 Bipolar disorder, current episode depressed, moderate: Secondary | ICD-10-CM | POA: Diagnosis not present

## 2023-05-16 ENCOUNTER — Other Ambulatory Visit: Payer: BC Managed Care – PPO

## 2023-05-18 ENCOUNTER — Emergency Department (HOSPITAL_COMMUNITY)
Admission: EM | Admit: 2023-05-18 | Discharge: 2023-05-18 | Disposition: A | Discharge status: Home / Self Care | Payer: BC Managed Care – PPO | Attending: Emergency Medicine | Admitting: Emergency Medicine

## 2023-05-18 ENCOUNTER — Encounter (HOSPITAL_COMMUNITY): Payer: Self-pay

## 2023-05-18 ENCOUNTER — Other Ambulatory Visit: Payer: Self-pay

## 2023-05-18 ENCOUNTER — Emergency Department (HOSPITAL_COMMUNITY): Payer: BC Managed Care – PPO

## 2023-05-18 DIAGNOSIS — R0600 Dyspnea, unspecified: Secondary | ICD-10-CM

## 2023-05-18 DIAGNOSIS — F17200 Nicotine dependence, unspecified, uncomplicated: Secondary | ICD-10-CM | POA: Diagnosis not present

## 2023-05-18 DIAGNOSIS — R0602 Shortness of breath: Secondary | ICD-10-CM | POA: Diagnosis not present

## 2023-05-18 DIAGNOSIS — R079 Chest pain, unspecified: Secondary | ICD-10-CM | POA: Diagnosis not present

## 2023-05-18 DIAGNOSIS — R000 Tachycardia, unspecified: Secondary | ICD-10-CM | POA: Diagnosis not present

## 2023-05-18 LAB — CBC
HCT: 49.6 % (ref 39.0–52.0)
Hemoglobin: 16.9 g/dL (ref 13.0–17.0)
MCH: 30.1 pg (ref 26.0–34.0)
MCHC: 34.1 g/dL (ref 30.0–36.0)
MCV: 88.4 fL (ref 80.0–100.0)
Platelets: 273 10*3/uL (ref 150–400)
RBC: 5.61 MIL/uL (ref 4.22–5.81)
RDW: 11.8 % (ref 11.5–15.5)
WBC: 9.5 10*3/uL (ref 4.0–10.5)
nRBC: 0 % (ref 0.0–0.2)

## 2023-05-18 LAB — BASIC METABOLIC PANEL
Anion gap: 12 (ref 5–15)
BUN: 10 mg/dL (ref 6–20)
CO2: 24 mmol/L (ref 22–32)
Calcium: 9.6 mg/dL (ref 8.9–10.3)
Chloride: 101 mmol/L (ref 98–111)
Creatinine, Ser: 1.15 mg/dL (ref 0.61–1.24)
GFR, Estimated: >60 mL/min (ref >60)
Glucose, Bld: 107 mg/dL — ABNORMAL HIGH (ref 70–99)
Potassium: 4.2 mmol/L (ref 3.5–5.1)
Sodium: 137 mmol/L (ref 135–145)

## 2023-05-18 LAB — TROPONIN I (HIGH SENSITIVITY)
Troponin I (High Sensitivity): 4 ng/L (ref <18)
Troponin I (High Sensitivity): 4 ng/L (ref <18)

## 2023-05-18 LAB — T4, FREE: Free T4: 1.07 ng/dL (ref 0.61–1.12)

## 2023-05-18 LAB — D-DIMER, QUANTITATIVE: D-Dimer, Quant: 0.33 ug/mL-FEU (ref 0.00–0.50)

## 2023-05-18 LAB — TSH: TSH: 0.764 u[IU]/mL (ref 0.350–4.500)

## 2023-05-18 MED ORDER — SODIUM CHLORIDE 0.9 % IV BOLUS
1000.0000 mL | Freq: Once | INTRAVENOUS | Status: AC
  Administered 2023-05-18: 1000 mL via INTRAVENOUS

## 2023-05-18 NOTE — ED Provider Notes (Signed)
[place] EMERGENCY DEPARTMENT AT [facility] Provider Note   CSN: [id number] Arrival date & time: 05/18/23  1328     History  Chief Complaint  Patient presents with   Shortness of Breath    Collin [name] is a 33 y.o. male.   Shortness of Breath    Pt states he suddenly started to feel short of breath about an hour prior to arrival.  He also was having pain in his chest.  Pressure in the center of the chest.  It did not radiate.  He also felt like he was weak all over and his speech was slurring.  That has all resolved at this point.  It lasted for about 30 minutes to an hour.  Pt states he feels worse when he goes back into his house.  CErtain days it gets worse in his house.  He also gets flushed when this occurs. He also had a Cabin crew recently that had very similar symptoms.  Pt states he has been having this occur off and on the last couple of weeks.  Pt drinks a few beers per day.  He does smoke. Home Medications Prior to Admission medications   Medication Sig Start Date End Date Taking? Authorizing Provider  acetaminophen (TYLENOL) 500 MG tablet Take 500 mg by mouth every 6 (six) hours as needed for headache. Patient not taking: Reported on 12/13/2022    [provider]  azelastine (ASTELIN) 0.1 % nasal spray USE 2 SPRAYS INTO BOTH NOSTRILS 2 TIMES DAILY AS DIRECTED Patient not taking: Reported on 12/24/2018 04/14/18   Ethelda Chick, MD  benzonatate (TESSALON) 100 MG capsule Take 1-2 capsules (100-200 mg total) by mouth 3 (three) times daily as needed for cough. Patient not taking: Reported on 12/24/2018 03/17/18   Benjiman Core D, PA-C  bictegravir-emtricitabine-tenofovir AF (BIKTARVY) 50-200-25 MG TABS tablet Take 1 tablet by mouth daily. 12/13/22   Vu, Gershon Mussel T, MD  ciprofloxacin (CIPRO) 500 MG tablet Take 1 tablet (500 mg total) by mouth 2 (two) times daily. Patient not taking: Reported on 12/13/2022 12/24/18   Lorre Nick, MD   Dextromethorphan-Guaifenesin [facility] DM MAXIMUM STRENGTH) 60-1200 MG TB12 Take 1 tablet by mouth every 12 (twelve) hours. Patient not taking: Reported on 12/24/2018 03/17/18   Benjiman Core D, PA-C  lisdexamfetamine (VYVANSE) 60 MG capsule Take 70 mg by mouth every morning.    [provider]  metroNIDAZOLE (FLAGYL) 500 MG tablet Take 1 tablet (500 mg total) by mouth 2 (two) times daily. Patient not taking: Reported on 12/13/2022 12/24/18   Lorre Nick, MD  sertraline (ZOLOFT) 50 MG tablet Take 1 tablet (50 mg total) by mouth daily. Patient not taking: Reported on 12/24/2018 03/08/15   Peyton Najjar, MD      Allergies    Patient has no known allergies.    Review of Systems   Review of Systems  Respiratory:  Positive for shortness of breath.     Physical Exam Updated Vital Signs BP (!) 126/94 (BP Location: Right Arm)   Pulse (!) 116   Temp 98 F (36.7 C) (Oral)   Resp 18   Ht 1.803 m (5\' 11" )   Wt 113.9 kg   SpO2 98%   BMI 35.02 kg/m  Physical Exam Vitals and nursing note reviewed.  Constitutional:      General: He is not in acute distress.    Appearance: He is well-developed.  HENT:     Head: Normocephalic and atraumatic.  Right Ear: External ear normal.     Left Ear: External ear normal.  Eyes:     General: No visual field deficit or scleral icterus.       Right eye: No discharge.        Left eye: No discharge.     Conjunctiva/sclera: Conjunctivae normal.  Neck:     Trachea: No tracheal deviation.  Cardiovascular:     Rate and Rhythm: Regular rhythm. Tachycardia present.  Pulmonary:     Effort: Pulmonary effort is normal. No respiratory distress.     Breath sounds: Normal breath sounds. No stridor. No wheezing or rales.  Abdominal:     General: Bowel sounds are normal. There is no distension.     Palpations: Abdomen is soft.     Tenderness: There is no abdominal tenderness. There is no guarding or rebound.  Musculoskeletal:        General:  No tenderness.     Cervical back: Neck supple.  Skin:    General: Skin is warm and dry.     Findings: No rash.  Neurological:     Mental Status: He is alert and oriented to person, place, and time.     Cranial Nerves: No cranial nerve deficit, dysarthria or facial asymmetry.     Sensory: No sensory deficit.     Motor: No abnormal muscle tone, seizure activity or pronator drift.     Coordination: Coordination normal.     Comments:  able to hold both legs off bed for 5 seconds, sensation intact in all extremities,  no left or right sided neglect, normal finger-nose exam bilaterally, no nystagmus noted   Psychiatric:        Mood and Affect: Mood normal.     ED Results / Procedures / Treatments   Labs (all labs ordered are listed, but only abnormal results are displayed) Labs Reviewed  BASIC METABOLIC PANEL - Abnormal; Notable for the following components:      Result Value   Glucose, Bld 107 (*)    All other components within normal limits  CBC  D-DIMER, QUANTITATIVE  TSH  T4, FREE  TROPONIN I (HIGH SENSITIVITY)  TROPONIN I (HIGH SENSITIVITY)    EKG EKG Interpretation  Date/Time:  Saturday May 18 2023 13:41:15 EDT Ventricular Rate:  142 PR Interval:  122 QRS Duration: 90 QT Interval:  368 QTC Calculation: 566 R Axis:   57 Text Interpretation: Sinus tachycardia T wave abnormality, consider inferior ischemia Abnormal ECG When compared with ECG of 02-Apr-2020 07:21, Since last tracing rate faster Confirmed by Linwood Dibbles [phone]) on 05/18/2023 4:30:28 PM  Radiology DG Chest 2 View  Result Date: 05/18/2023 CLINICAL DATA:  Chest pain and shortness of breath EXAM: CHEST - 2 VIEW COMPARISON:  Chest radiograph dated 04/02/2020 FINDINGS: Normal lung volumes. No focal consolidations. No pleural effusion or pneumothorax. The heart size and mediastinal contours are within normal limits. No acute osseous abnormality. IMPRESSION: No active cardiopulmonary disease. Electronically Signed    By: Agustin Cree M.D.   On: 05/18/2023 14:23    Procedures Procedures    Medications Ordered in ED Medications  sodium chloride 0.9 % bolus 1,000 mL (1,000 mLs Intravenous New Bag/Given 05/18/23 1645)    ED Course/ Medical Decision Making/ A&P Clinical Course as of 05/18/23 1843  Sat May 18, 2023  1629 CBC normal.  Metabolic panel normal.  Troponin normal [JK]  1629 DG Chest 2 View Chest x-ray without acute findings [JK]  1824 CBC and metabolic  panel normal.  Troponin D-dimer and TSH normal. [JK]    Clinical Course User Index [JK] Linwood Dibbles, MD                             Medical Decision Making Frontal diagnosis includes but not limited to PE, anemia, dehydration, hyperthyroidism, panic attack  Problems Addressed: Dyspnea, unspecified type: acute illness or injury that poses a threat to life or bodily functions Sinus tachycardia: acute illness or injury  Amount and/or Complexity of Data Reviewed Labs: ordered. Decision-making details documented in ED Course. Radiology: ordered and independent interpretation performed. Decision-making details documented in ED Course.   She presented to ED with complaints of shortness of breath.  Symptoms started to resolve while he was in the ED without any intervention.  Patient did have a sinus tachycardia on arrival.  His heart rate has slowly returned to normal.  On repeat exam patient is not tachycardic he is breathing easily.  Labs do not show any onset anemia.  No dehydration.  D-dimer is negative arguing against PE.  No signs of cardiac ischemia.  Patient's thyroid tests are also normal.  Possible symptoms may have been related to a panic attack.  At this time he is feeling well and back to his baseline.  Will have him follow-up with a primary care doctor next week to be rechecked.        Final Clinical Impression(s) / ED Diagnoses Final diagnoses:  Dyspnea, unspecified type  Sinus tachycardia    Rx / DC Orders ED Discharge  Orders     None         Linwood Dibbles, MD 05/18/23 1844

## 2023-05-18 NOTE — Discharge Instructions (Signed)
It is possible your symptoms today were related to a panic attack.  The test today in the ED were reassuring.  No signs of heart or lung problems.  Your blood tests were normal.  Follow-up with a primary care doctor to be rechecked

## 2023-05-18 NOTE — ED Triage Notes (Signed)
Pt c/o SOB started an hour ago. Pt states he feels like he's slurring his words in the past hours. Pt's LKW today at 1236. Pt able to speak in complete sentences. Pt c/o chest pressure an hour ago. Pt states he has a dry mouth and burning sensation all over his skin.

## 2023-05-22 DIAGNOSIS — F902 Attention-deficit hyperactivity disorder, combined type: Secondary | ICD-10-CM | POA: Diagnosis not present

## 2023-05-22 DIAGNOSIS — F3132 Bipolar disorder, current episode depressed, moderate: Secondary | ICD-10-CM | POA: Diagnosis not present

## 2023-05-30 ENCOUNTER — Ambulatory Visit: Payer: 59 | Admitting: Internal Medicine

## 2023-06-28 DIAGNOSIS — F902 Attention-deficit hyperactivity disorder, combined type: Secondary | ICD-10-CM | POA: Diagnosis not present

## 2023-06-28 DIAGNOSIS — F3132 Bipolar disorder, current episode depressed, moderate: Secondary | ICD-10-CM | POA: Diagnosis not present

## 2023-11-25 ENCOUNTER — Telehealth: Payer: Self-pay

## 2023-11-25 NOTE — Telephone Encounter (Signed)
Left patient a voice mail to call back to schedule an appointment with Dr. Renold Don  for hepatitis B.

## 2023-12-30 ENCOUNTER — Ambulatory Visit
Admission: RE | Admit: 2023-12-30 | Discharge: 2023-12-30 | Disposition: A | Discharge status: Home / Self Care | Payer: 59 | Source: Ambulatory Visit | Attending: Family Medicine | Admitting: Family Medicine

## 2023-12-30 ENCOUNTER — Ambulatory Visit (INDEPENDENT_AMBULATORY_CARE_PROVIDER_SITE_OTHER): Payer: 59

## 2023-12-30 VITALS — BP 135/99 | HR 118 | Temp 98.2°F | Resp 20

## 2023-12-30 DIAGNOSIS — J40 Bronchitis, not specified as acute or chronic: Secondary | ICD-10-CM

## 2023-12-30 DIAGNOSIS — R058 Other specified cough: Secondary | ICD-10-CM

## 2023-12-30 DIAGNOSIS — Z21 Asymptomatic human immunodeficiency virus [HIV] infection status: Secondary | ICD-10-CM | POA: Diagnosis not present

## 2023-12-30 DIAGNOSIS — J329 Chronic sinusitis, unspecified: Secondary | ICD-10-CM | POA: Diagnosis not present

## 2023-12-30 HISTORY — DX: Asymptomatic human immunodeficiency virus (hiv) infection status: Z21

## 2023-12-30 MED ORDER — PROMETHAZINE-DM 6.25-15 MG/5ML PO SYRP: 5.0000 mL | ORAL_SOLUTION | Freq: Three times a day (TID) | ORAL | 0 refills | Status: DC | PRN

## 2023-12-30 MED ORDER — PREDNISONE 20 MG PO TABS: ORAL_TABLET | ORAL | 0 refills | Status: DC

## 2023-12-30 MED ORDER — ALBUTEROL SULFATE HFA 108 (90 BASE) MCG/ACT IN AERS: 1.0000 | INHALATION_SPRAY | Freq: Four times a day (QID) | RESPIRATORY_TRACT | 0 refills | Status: DC | PRN

## 2023-12-30 MED ORDER — AMOXICILLIN-POT CLAVULANATE 875-125 MG PO TABS: 1.0000 | ORAL_TABLET | Freq: Two times a day (BID) | ORAL | 0 refills | Status: DC

## 2023-12-30 MED ORDER — ALBUTEROL SULFATE HFA 108 (90 BASE) MCG/ACT IN AERS: 1.0000 | INHALATION_SPRAY | Freq: Four times a day (QID) | RESPIRATORY_TRACT | 0 refills | Status: AC | PRN

## 2023-12-30 NOTE — ED Triage Notes (Addendum)
Pt c/o cough, head/chest congestion, lower back pain/back pain after voids, body aches, HA-sx started 2 weeks ago-denies known fever-last dose tylenol yesterday-NAD-steady gait

## 2023-12-30 NOTE — Discharge Instructions (Addendum)
Will update you with your chest x-ray results later. For now, start amoxicillin-clavulanate and prednisone for sinobronchitis.

## 2023-12-30 NOTE — ED Notes (Signed)
Patient is being discharged from the Urgent Care and sent to the Emergency Department via POV . Per Urban Gibson, PA-C, patient is in need of higher level of care due to multiple c/o noncompliant with infectious disease follow up. Patient is aware and verbalizes understanding of plan of care.  Vitals:   12/30/23 1039  BP: (!) 135/99  Pulse: (!) 118  Resp: 20  Temp: 98.2 F (36.8 C)  SpO2: 94%

## 2023-12-30 NOTE — ED Provider Notes (Signed)
[facility] - URGENT CARE CENTER  Note:  This document was prepared using Conservation officer, historic buildings and may include unintentional dictation errors.  MRN: [id number] DOB: 09-29-1990  Subjective:   Collin [name] is a 33 y.o. male presenting for 2-week history of acute onset persistent coughing, sinus and chest congestion, body pains, fevers.  Has also started to have back pain, dysuria.  Has a history of HIV, alpha-1 deficiency, chronic hepatitis.  Has not followed up with infectious disease.  Was supposed to follow-up 1 month ago.  He is taking his Biktarvy.  Would like an inhaler to help him with his breathing.  Patient is a smoker.  Has previously taken prednisone and done well with it.  No current facility-administered medications for this encounter.  Current Outpatient Medications:    acetaminophen (TYLENOL) 500 MG tablet, Take 500 mg by mouth every 6 (six) hours as needed for headache. (Patient not taking: Reported on 12/13/2022), Disp: , Rfl:    azelastine (ASTELIN) 0.1 % nasal spray, USE 2 SPRAYS INTO BOTH NOSTRILS 2 TIMES DAILY AS DIRECTED (Patient not taking: No sig reported), Disp: 30 mL, Rfl: 5   benzonatate (TESSALON) 100 MG capsule, Take 1-2 capsules (100-200 mg total) by mouth 3 (three) times daily as needed for cough. (Patient not taking: Reported on 12/24/2018), Disp: 40 capsule, Rfl: 0   bictegravir-emtricitabine-tenofovir AF (BIKTARVY) 50-200-25 MG TABS tablet, Take 1 tablet by mouth daily., Disp: 30 tablet, Rfl: 11   ciprofloxacin (CIPRO) 500 MG tablet, Take 1 tablet (500 mg total) by mouth 2 (two) times daily. (Patient not taking: Reported on 12/13/2022), Disp: 14 tablet, Rfl: 0   Dextromethorphan-Guaifenesin (MUCINEX DM MAXIMUM STRENGTH) 60-1200 MG TB12, Take 1 tablet by mouth every 12 (twelve) hours. (Patient not taking: Reported on 12/24/2018), Disp: 20 each, Rfl: 0   lisdexamfetamine (VYVANSE) 60 MG capsule, Take 70 mg by mouth every morning., Disp: , Rfl:     metroNIDAZOLE (FLAGYL) 500 MG tablet, Take 1 tablet (500 mg total) by mouth 2 (two) times daily. (Patient not taking: Reported on 12/13/2022), Disp: 14 tablet, Rfl: 0   sertraline (ZOLOFT) 50 MG tablet, Take 1 tablet (50 mg total) by mouth daily. (Patient not taking: Reported on 12/24/2018), Disp: 30 tablet, Rfl: 2   No Known Allergies  Past Medical History:  Diagnosis Date   ADD (attention deficit disorder)    Anxiety    Bipolar 1 disorder (HCC)    Depression    Meningitis spinal      Past Surgical History:  Procedure Laterality Date   NO PAST SURGERIES      Family History  Problem Relation Age of Onset   Hypertension Father    Healthy Mother    Stroke Paternal Uncle    Heart disease Maternal Grandmother    Heart disease Maternal Grandfather    Stroke Paternal Grandfather     Social History   Tobacco Use   Smoking status: Every Day    Current packs/day: 1.00    Average packs/day: 1 pack/day for 13.0 years (13.0 ttl pk-yrs)    Types: Cigarettes   Smokeless tobacco: Never  Vaping Use   Vaping status: Never Used  Substance Use Topics   Alcohol use: Yes    Comment: weekly   Drug use: Not Currently    ROS   Objective:   Vitals: BP (!) 135/99 (BP Location: Right Arm)   Pulse (!) 118   Temp 98.2 F (36.8 C) (Oral)   Resp 20   SpO2  94%   Physical Exam Constitutional:      General: He is not in acute distress.    Appearance: Normal appearance. He is well-developed. He is not ill-appearing, toxic-appearing or diaphoretic.  HENT:     Head: Normocephalic and atraumatic.     Right Ear: External ear normal.     Left Ear: External ear normal.     Nose: Nose normal.     Mouth/Throat:     Mouth: Mucous membranes are moist.  Eyes:     General: No scleral icterus.       Right eye: No discharge.        Left eye: No discharge.     Extraocular Movements: Extraocular movements intact.  Cardiovascular:     Rate and Rhythm: Normal rate and regular rhythm.      Heart sounds: Normal heart sounds. No murmur heard.    No friction rub. No gallop.  Pulmonary:     Effort: Pulmonary effort is normal. No respiratory distress.     Breath sounds: No stridor. Rhonchi (right sided) present. No wheezing or rales.     Comments: Decreased lung sounds throughout. Abdominal:     General: There is no distension.     Palpations: There is no mass.     Tenderness: There is no abdominal tenderness. There is right CVA tenderness and left CVA tenderness. There is no guarding or rebound.     Hernia: No hernia is present.  Neurological:     Mental Status: He is alert and oriented to person, place, and time.  Psychiatric:        Mood and Affect: Mood normal.        Behavior: Behavior normal.        Thought Content: Thought content normal.     Assessment and Plan :   PDMP not reviewed this encounter.  1. Sinobronchitis   2. Productive cough   3. HIV infection, unspecified symptom status (HCC)    Will manage for sinobronchitis with Augmentin and prednisone.  Recommended general supportive care.  I do have concern for patient having an acute process such as acute on chronic hepatitis, obstructive uropathy, recommended presenting to the emergency room for higher level of evaluation and intervention than we can provide in the urgent care setting.  Patient contracts for safety and will head there now.   Wallis Bamberg, [place] 12/30/23 1610

## 2024-02-21 ENCOUNTER — Other Ambulatory Visit: Payer: Self-pay

## 2024-02-21 ENCOUNTER — Other Ambulatory Visit: Payer: 59

## 2024-02-21 DIAGNOSIS — Z113 Encounter for screening for infections with a predominantly sexual mode of transmission: Secondary | ICD-10-CM

## 2024-02-21 DIAGNOSIS — B180 Chronic viral hepatitis B with delta-agent: Secondary | ICD-10-CM

## 2024-02-21 DIAGNOSIS — B20 Human immunodeficiency virus [HIV] disease: Secondary | ICD-10-CM

## 2024-02-22 LAB — C. TRACHOMATIS/N. GONORRHOEAE RNA
C. trachomatis RNA, TMA: NOT DETECTED
N. gonorrhoeae RNA, TMA: NOT DETECTED

## 2024-02-25 LAB — HIV-1 RNA QUANT-NO REFLEX-BLD
HIV 1 RNA Quant: NOT DETECTED {copies}/mL
HIV-1 RNA Quant, Log: NOT DETECTED {Log_copies}/mL

## 2024-02-25 LAB — T-HELPER CELLS (CD4) COUNT (NOT AT ARMC)
Absolute CD4: 1822 {cells}/uL — ABNORMAL HIGH (ref 490–1740)
CD4 T Helper %: 43 % (ref 30–61)
Total lymphocyte count: 4217 {cells}/uL — ABNORMAL HIGH (ref 850–3900)

## 2024-02-25 LAB — HEPATITIS A ANTIBODY, TOTAL: Hepatitis A AB,Total: REACTIVE — AB

## 2024-02-25 LAB — HEPATITIS C ANTIBODY: Hepatitis C Ab: NONREACTIVE

## 2024-02-25 LAB — HEPATITIS B E ANTIBODY: Hep B E Ab: NONREACTIVE

## 2024-02-25 LAB — RPR: RPR Ser Ql: NONREACTIVE

## 2024-02-25 LAB — HEPATITIS B E ANTIGEN: Hep B E Ag: REACTIVE — AB

## 2024-02-25 LAB — HEPATITIS B SURFACE ANTIGEN: Hepatitis B Surface Ag: NONREACTIVE

## 2024-02-25 LAB — HEPATITIS B SURFACE ANTIBODY, QUANTITATIVE: Hep B S AB Quant (Post): 31 m[IU]/mL (ref >=10)

## 2024-03-01 ENCOUNTER — Encounter: Payer: Self-pay | Admitting: Infectious Disease

## 2024-03-01 DIAGNOSIS — Z8619 Personal history of other infectious and parasitic diseases: Secondary | ICD-10-CM

## 2024-03-01 DIAGNOSIS — B20 Human immunodeficiency virus [HIV] disease: Secondary | ICD-10-CM

## 2024-03-01 HISTORY — DX: Personal history of other infectious and parasitic diseases: Z86.19

## 2024-03-01 HISTORY — DX: Human immunodeficiency virus (HIV) disease: B20

## 2024-03-02 ENCOUNTER — Other Ambulatory Visit: Payer: Self-pay

## 2024-03-02 ENCOUNTER — Ambulatory Visit (INDEPENDENT_AMBULATORY_CARE_PROVIDER_SITE_OTHER): Payer: 59 | Admitting: Infectious Disease

## 2024-03-02 ENCOUNTER — Encounter: Payer: Self-pay | Admitting: Infectious Disease

## 2024-03-02 ENCOUNTER — Other Ambulatory Visit (HOSPITAL_COMMUNITY)
Admission: RE | Admit: 2024-03-02 | Discharge: 2024-03-02 | Disposition: A | Discharge status: Home / Self Care | Source: Ambulatory Visit | Attending: Infectious Disease | Admitting: Infectious Disease

## 2024-03-02 VITALS — BP 135/91 | HR 122 | Temp 97.5°F | Ht 71.0 in | Wt 246.0 lb

## 2024-03-02 DIAGNOSIS — Z7185 Encounter for immunization safety counseling: Secondary | ICD-10-CM | POA: Insufficient documentation

## 2024-03-02 DIAGNOSIS — E8801 Alpha-1-antitrypsin deficiency: Secondary | ICD-10-CM | POA: Diagnosis not present

## 2024-03-02 DIAGNOSIS — F1090 Alcohol use, unspecified, uncomplicated: Secondary | ICD-10-CM

## 2024-03-02 DIAGNOSIS — Z789 Other specified health status: Secondary | ICD-10-CM | POA: Diagnosis present

## 2024-03-02 DIAGNOSIS — Z1212 Encounter for screening for malignant neoplasm of rectum: Secondary | ICD-10-CM

## 2024-03-02 DIAGNOSIS — Z113 Encounter for screening for infections with a predominantly sexual mode of transmission: Secondary | ICD-10-CM

## 2024-03-02 DIAGNOSIS — Z8619 Personal history of other infectious and parasitic diseases: Secondary | ICD-10-CM

## 2024-03-02 DIAGNOSIS — B20 Human immunodeficiency virus [HIV] disease: Secondary | ICD-10-CM | POA: Insufficient documentation

## 2024-03-02 DIAGNOSIS — Z23 Encounter for immunization: Secondary | ICD-10-CM

## 2024-03-02 MED ORDER — BICTEGRAVIR-EMTRICITAB-TENOFOV 50-200-25 MG PO TABS: 1.0000 | ORAL_TABLET | Freq: Every day | ORAL | 11 refills | Status: AC

## 2024-03-02 NOTE — Progress Notes (Signed)
 Subjective:    Patient ID: Collin [name], male    DOB: 1990/07/29, 34 y.o.   MRN: [id number]  HPI  Discussed the use of AI scribe software for clinical note transcription with the patient, who gave verbal consent to proceed.  History of Present Illness   The patient, with a history of HIV and alpha 1 antitrypsin deficiency, presents for a routine follow-up. He reports adherence to his HIV medication, Biktarvy, but admits to missing doses more often than he should. He expresses concern about a recent Hepatitis B infection, which he believes may have occurred during a period of non-adherence to his HIV medication. He denies any intravenous drug use and believes the infection was sexually transmitted. The patient also reports a recent episode of heavy drinking, lasting about a week, which resulted in significant fatigue and liver pain. He has since recovered and returned to his usual pattern of moderate drinking (2-3 craft beers, 3-4 days a week). The patient also reports having receptive anal intercourse and agrees to an anal Pap smear for cancer screening.       Past Medical History:  Diagnosis Date   ADD (attention deficit disorder)    Anxiety    Bipolar 1 disorder (HCC)    Depression    History of hepatitis B 03/01/2024   HIV (human immunodeficiency virus) infection (HCC)    HIV disease (HCC) 03/01/2024   Meningitis spinal     Past Surgical History:  Procedure Laterality Date   NO PAST SURGERIES      Family History  Problem Relation Age of Onset   Hypertension Father    Healthy Mother    Stroke Paternal Uncle    Heart disease Maternal Grandmother    Heart disease Maternal Grandfather    Stroke Paternal Grandfather       Social History   Socioeconomic History   Marital status: Single    Spouse name: Not on file   Number of children: 0   Years of education: Bachelors   Highest education level: Not on file  Occupational History   Occupation: Student    Comment:  Graduates May 2017 - History/[employer]ilos[employer]  Tobacco Use   Smoking status: Every Day    Current packs/day: 1.00    Average packs/day: 1 pack/day for 13.0 years (13.0 ttl pk-yrs)    Types: Cigarettes   Smokeless tobacco: Never  Vaping Use   Vaping status: Never Used  Substance and Sexual Activity   Alcohol use: Yes    Comment: weekly   Drug use: Not Currently   Sexual activity: Yes    Comment: declined condoms  Other Topics Concern   Not on file  Social History Narrative   Lives at home with parents.   Right-handed.   2 caffeinated drinks per day.   Social Drivers of Corporate investment banker Strain: Not on file  Food Insecurity: Not on file  Transportation Needs: Not on file  Physical Activity: Not on file  Stress: Not on file  Social Connections: Not on file    No Known Allergies   Current Outpatient Medications:    acetaminophen (TYLENOL) 500 MG tablet, Take 500 mg by mouth every 6 (six) hours as needed for headache., Disp: , Rfl:    albuterol (VENTOLIN HFA) 108 (90 Base) MCG/ACT inhaler, Inhale 1-2 puffs into the lungs every 6 (six) hours as needed for wheezing or shortness of breath., Disp: 18 g, Rfl: 0   azelastine (ASTELIN) 0.1 % nasal spray, USE 2  SPRAYS INTO BOTH NOSTRILS 2 TIMES DAILY AS DIRECTED, Disp: 30 mL, Rfl: 5   bictegravir-emtricitabine-tenofovir AF (BIKTARVY) 50-200-25 MG TABS tablet, Take 1 tablet by mouth daily., Disp: 30 tablet, Rfl: 11   lisdexamfetamine (VYVANSE) 60 MG capsule, Take 70 mg by mouth every morning., Disp: , Rfl:    Review of Systems  Constitutional:  Negative for activity change, appetite change, chills, diaphoresis, fatigue, fever and unexpected weight change.  HENT:  Negative for congestion, rhinorrhea, sinus pressure, sneezing, sore throat and trouble swallowing.   Eyes:  Negative for photophobia and visual disturbance.  Respiratory:  Negative for cough, chest tightness, shortness of breath, wheezing and stridor.   Cardiovascular:   Negative for chest pain, palpitations and leg swelling.  Gastrointestinal:  Negative for abdominal distention, abdominal pain, anal bleeding, blood in stool, constipation, diarrhea, nausea and vomiting.  Genitourinary:  Negative for difficulty urinating, dysuria, flank pain and hematuria.  Musculoskeletal:  Negative for arthralgias, back pain, gait problem, joint swelling and myalgias.  Skin:  Negative for color change, pallor, rash and wound.  Neurological:  Negative for dizziness, tremors, weakness and light-headedness.  Hematological:  Negative for adenopathy. Does not bruise/bleed easily.  Psychiatric/Behavioral:  Negative for agitation, behavioral problems, confusion, decreased concentration, dysphoric mood and sleep disturbance.        Objective:   Physical Exam Constitutional:      Appearance: He is well-developed.  HENT:     Head: Normocephalic and atraumatic.  Eyes:     Conjunctiva/sclera: Conjunctivae normal.  Cardiovascular:     Rate and Rhythm: Normal rate and regular rhythm.  Pulmonary:     Effort: Pulmonary effort is normal. No respiratory distress.     Breath sounds: No wheezing.  Abdominal:     General: There is no distension.     Palpations: Abdomen is soft.  Musculoskeletal:        General: No tenderness. Normal range of motion.     Cervical back: Normal range of motion and neck supple.  Skin:    General: Skin is warm and dry.     Coloration: Skin is not pale.     Findings: No erythema or rash.  Neurological:     General: No focal deficit present.     Mental Status: He is alert and oriented to person, place, and time.  Psychiatric:        Mood and Affect: Mood normal.        Behavior: Behavior normal.        Thought Content: Thought content normal.        Judgment: Judgment normal.           Assessment & Plan:   Assessment and Plan    HIV infection HIV well-controlled with undetectable viral load on Biktarvy. High CD4 count. History of missed  doses but generally adherent. - Continue Biktarvy  Hepatitis B infection Previously acute Hepatitis B, now nonreactive surface antigen indicating clearance. Hep E antigen is +, hepatitis E antibody +  is Biktarvy provides effective dual coverage. - Continue Biktarvy. - Consider Hepatitis B DNA testing at next visit.  Alpha-1 antitrypsin deficiency (heterozygous) Heterozygous deficiency affects liver and lungs. Alcohol avoidance crucial to prevent liver damage. - Advise avoidance of alcohol.  Alcohol use Recent heavy alcohol use causing liver pain and fatigue. Risk of alcoholic hepatitis and liver damage due to alpha-1 antitrypsin deficiency. - Advise reduction of alcohol intake.   Anal cancer screening Increased risk due to history of anal receptive intercourse and  HIV. Anal Pap smear recommended for screening. - Perform anal Pap smear.      Leukocytosis and slightly elevated CD4: he was worried about risk for lymphoma but I suspect these labs are simply elevated range of normal and noted he seems to have chronic elevated wBC going back nearly a decade  Vaccine counseling: give flu and COVID vaccines

## 2024-03-05 LAB — CYTOLOGY - PAP: Diagnosis: NEGATIVE

## 2024-03-17 ENCOUNTER — Encounter: Payer: Self-pay | Admitting: Internal Medicine

## 2024-07-17 ENCOUNTER — Encounter: Payer: Self-pay | Admitting: Advanced Practice Midwife

## 2024-09-07 ENCOUNTER — Telehealth: Payer: Self-pay

## 2024-09-07 ENCOUNTER — Ambulatory Visit: Payer: Self-pay | Admitting: Infectious Disease

## 2024-09-07 NOTE — Telephone Encounter (Signed)
 Attempted to call patient regarding missed appointment. Left voicemail requesting call back. Julien Odor, RMA
